# Patient Record
Sex: Female | Born: 1990 | Race: White | Hispanic: No | Marital: Married | Smoking: Never smoker
Health system: Southern US, Community
[De-identification: ages and names within clinical notes are randomized; demographics above are authoritative.]

## PROBLEM LIST (undated history)

## (undated) DIAGNOSIS — O139 Gestational [pregnancy-induced] hypertension without significant proteinuria, unspecified trimester: Secondary | ICD-10-CM

## (undated) HISTORY — PX: WISDOM TOOTH EXTRACTION: SHX21

## (undated) HISTORY — DX: Gestational (pregnancy-induced) hypertension without significant proteinuria, unspecified trimester: O13.9

## (undated) HISTORY — PX: DRUG INDUCED ENDOSCOPY: SHX6808

---

## 2019-04-21 DIAGNOSIS — Z01419 Encounter for gynecological examination (general) (routine) without abnormal findings: Secondary | ICD-10-CM | POA: Diagnosis not present

## 2019-04-21 DIAGNOSIS — Z124 Encounter for screening for malignant neoplasm of cervix: Secondary | ICD-10-CM | POA: Diagnosis not present

## 2019-04-21 DIAGNOSIS — N62 Hypertrophy of breast: Secondary | ICD-10-CM | POA: Diagnosis not present

## 2019-06-13 ENCOUNTER — Institutional Professional Consult (permissible substitution): Payer: BC Managed Care – PPO | Admitting: Plastic Surgery

## 2019-06-18 ENCOUNTER — Encounter: Payer: Self-pay | Admitting: Plastic Surgery

## 2019-06-18 ENCOUNTER — Ambulatory Visit: Payer: BC Managed Care – PPO | Admitting: Plastic Surgery

## 2019-06-18 ENCOUNTER — Other Ambulatory Visit: Payer: Self-pay

## 2019-06-18 DIAGNOSIS — M549 Dorsalgia, unspecified: Secondary | ICD-10-CM | POA: Insufficient documentation

## 2019-06-18 DIAGNOSIS — M542 Cervicalgia: Secondary | ICD-10-CM

## 2019-06-18 DIAGNOSIS — M546 Pain in thoracic spine: Secondary | ICD-10-CM | POA: Diagnosis not present

## 2019-06-18 DIAGNOSIS — G8929 Other chronic pain: Secondary | ICD-10-CM

## 2019-06-18 DIAGNOSIS — N62 Hypertrophy of breast: Secondary | ICD-10-CM

## 2019-06-18 NOTE — Progress Notes (Signed)
Patient ID: Kelsey Barnett, female    DOB: [birth year], 29 y.o.   MRN: [id number]   Chief Complaint  Patient presents with  . Breast Problem    Mammary Hyperplasia: The patient is a 29 y.o. female with a history of mammary hyperplasia for several years.  She has extremely large breasts causing symptoms that include the following: Back pain in the upper and lower back, including neck pain. She pulls or pins her bra straps to provide better lift and relief of the pressure and pain. She notices relief by holding her breast up manually.  Her shoulder straps cause grooves and pain and pressure that requires padding for relief. Pain medication is sometimes required with motrin and tylenol.  Activities that are hindered by enlarged breasts include: exercise and running. She also noticed that she carries the left shoulder significantly higher which may be related to the size difference.  Her breasts are extremely large and fairly symmetric but the left is likely 200 gm larger than the right.  She has hyperpigmentation of the inframammary area on both sides.  The sternal to nipple distance on the right is 21 cm and the left is 23 cm.  The IMF distance is 11 cm.  She is 5 feet 3 inches tall and weighs 130 pounds.  Preoperative bra size = 34DD cup.  The estimated excess breast tissue to be removed at the time of surgery = 300 grams on the left and 300 grams on the right.  Mammogram history: none.  The patient's maternal grandfather dad 5 sisters that died with breast cancer.  She has not had PT as of this time.   She is married and does not have any children.   Review of Systems  Constitutional: Positive for activity change. Negative for appetite change.  HENT: Negative.   Eyes: Negative.   Respiratory: Negative for chest tightness and shortness of breath.   Cardiovascular: Negative.   Gastrointestinal: Negative.   Endocrine: Negative.   Genitourinary: Negative.   Musculoskeletal: Positive for back pain  and neck pain.  Skin: Negative.   Neurological: Negative.   Hematological: Negative.   Psychiatric/Behavioral: Negative.     History reviewed. No pertinent past medical history.  History reviewed. No pertinent surgical history.   No current outpatient medications on file.   Objective:   Vitals:   06/18/19 1420  BP: 119/80  Pulse: 95  Temp: 98 F (36.7 C)  SpO2: 99%    Physical Exam Vitals and nursing note reviewed.  Constitutional:      Appearance: Normal appearance.  HENT:     Head: Normocephalic and atraumatic.  Cardiovascular:     Rate and Rhythm: Normal rate.     Pulses: Normal pulses.  Pulmonary:     Effort: Pulmonary effort is normal.  Abdominal:     General: Abdomen is flat. There is no distension.     Tenderness: There is no abdominal tenderness.  Skin:    General: Skin is warm.     Capillary Refill: Capillary refill takes less than 2 seconds.  Neurological:     General: No focal deficit present.     Mental Status: She is alert and oriented to person, place, and time.  Psychiatric:        Mood and Affect: Mood normal.        Behavior: Behavior normal.     Assessment & Plan:  Symptomatic mammary hypertrophy  Chronic bilateral thoracic back pain  Neck pain  Recommend  bilateral breast reduction, PT and mammogram.  Will request release of OB/GYN note.  The risk that can be encountered with breast reduction were discussed and include the following but not limited to these:  Breast asymmetry, fluid accumulation, firmness of the breast, inability to breast feed, loss of nipple or areola, skin loss, decrease or no nipple sensation, fat necrosis of the breast tissue, bleeding, infection, healing delay.  There are risks of anesthesia, changes to skin sensation and injury to nerves or blood vessels.  The muscle can be temporarily or permanently injured.  You may have an allergic reaction to tape, suture, glue, blood products which can result in skin  discoloration, swelling, pain, skin lesions, poor healing.  Any of these can lead to the need for revisonal surgery or stage procedures.  A reduction has potential to interfere with diagnostic procedures.  Nipple or breast piercing can increase risks of infection.  This procedure is best done when the breast is fully developed.  Changes in the breast will continue to occur over time.  Pregnancy can alter the outcomes of previous breast reduction surgery, weight gain and weigh loss can also effect the long term appearance.    Alena Bills Eydan Chianese, DO

## 2019-06-20 ENCOUNTER — Other Ambulatory Visit: Payer: Self-pay

## 2019-06-20 ENCOUNTER — Ambulatory Visit
Admission: RE | Admit: 2019-06-20 | Discharge: 2019-06-20 | Disposition: A | Discharge status: Home / Self Care | Payer: BC Managed Care – PPO | Source: Ambulatory Visit | Attending: Plastic Surgery | Admitting: Plastic Surgery

## 2019-06-20 DIAGNOSIS — G8929 Other chronic pain: Secondary | ICD-10-CM

## 2019-06-20 DIAGNOSIS — N62 Hypertrophy of breast: Secondary | ICD-10-CM

## 2019-06-20 DIAGNOSIS — M542 Cervicalgia: Secondary | ICD-10-CM

## 2019-06-20 DIAGNOSIS — M546 Pain in thoracic spine: Secondary | ICD-10-CM

## 2019-06-20 DIAGNOSIS — Z1231 Encounter for screening mammogram for malignant neoplasm of breast: Secondary | ICD-10-CM | POA: Diagnosis not present

## 2019-07-09 ENCOUNTER — Encounter: Payer: Self-pay | Admitting: Physical Therapy

## 2019-07-09 ENCOUNTER — Other Ambulatory Visit: Payer: Self-pay

## 2019-07-09 ENCOUNTER — Ambulatory Visit: Payer: BC Managed Care – PPO | Attending: Plastic Surgery | Admitting: Physical Therapy

## 2019-07-09 DIAGNOSIS — G8929 Other chronic pain: Secondary | ICD-10-CM

## 2019-07-09 DIAGNOSIS — M25512 Pain in left shoulder: Secondary | ICD-10-CM | POA: Insufficient documentation

## 2019-07-09 DIAGNOSIS — M546 Pain in thoracic spine: Secondary | ICD-10-CM | POA: Insufficient documentation

## 2019-07-10 NOTE — Therapy (Signed)
[facility] Outpatient Rehabilitation [facility] [address] [place], [place], [address] Phone: [phone]   Fax:  [phone]  Physical Therapy Evaluation  Patient Details  Name: Kelsey Barnett MRN: [id number] Date of Birth: August 19, 1990 Referring Provider (PT): Foster Simpson, DO   Encounter Date: 07/09/2019  PT End of Session - 07/09/19 1501    Visit Number  1    Number of Visits  7    Authorization Type  BCBS- 60 vl    PT Start Time  1500    PT Stop Time  1539    PT Time Calculation (min)  39 min    Activity Tolerance  Patient tolerated treatment well    Behavior During Therapy  [facility] for tasks assessed/performed       History reviewed. No pertinent past medical history.  History reviewed. No pertinent surgical history.  There were no vitals filed for this visit.   Subjective Assessment - 07/09/19 1501    Subjective  Mid thoracic region pops any time I sit up. Shoulders and back ache after sitting at work. Left shoulder is worse, running seems to set it off. Remember shoulder pain back to 2013 with back pain. 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 Yoga and running for exercise.    How long can you sit comfortably?  before lunch time find myself changing position    Currently in Pain?  Yes    Pain Score  4     Pain Location  Back    Pain Orientation  Upper    Pain Descriptors / Indicators  Aching    Aggravating Factors   sitting, running for shoulder, standing    Pain Relieving Factors  stretching, heat         [facility] PT Assessment - 07/09/19 0001      Assessment   Medical Diagnosis  mammary hypertrophy, neck pain    Referring Provider (PT)  Foster Simpson, DO    Onset Date/Surgical Date  --   2013   Hand Dominance  Right      Precautions   Precautions  None      Restrictions   Weight Bearing Restrictions  No      Balance Screen   Has the patient fallen in the past 6 months  No      Home Environment   Living  Environment  Private residence    Living Arrangements  Spouse/significant other      Prior Function   Level of Independence  Independent    Vocation  Full time employment    Microbiologist on computer, working from home      Cognition   Overall Cognitive Status  Within Functional Limits for tasks assessed      Observation/Other Assessments   Focus on Therapeutic Outcomes (FOTO)   --   not entered     Sensation   Additional Comments  Lt N/T from shoulder to elbow, HA weekly      Posture/Postural Control   Posture Comments  decreased thoracic and lumbar curves with bil winging scapula      ROM / Strength   AROM / PROM / Strength  Strength      Strength   Overall Strength Comments  gross shoulder 5/5    Strength Assessment Site  Shoulder    Right/Left Shoulder  Right;Left      Palpation   Palpation comment  tightness through upper traps and all periscapular musculature  of care cert/re-cert     Problem List Patient Active Problem List   Diagnosis Date Noted  . Symptomatic mammary hypertrophy 06/18/2019  . Back pain 06/18/2019  . Neck pain 06/18/2019    Sholonda Jobst C. Reubin Bushnell PT, DPT 07/10/19 6:35 AM   [facility] Outpatient Rehabilitation [facility] [address] [place], [place], [address] Phone: [phone]   Fax:  [phone]  Name: Kelsey Barnett MRN: [id number] Date of Birth: 10-Nov-1990  of care cert/re-cert     Problem List Patient Active Problem List   Diagnosis Date Noted  . Symptomatic mammary hypertrophy 06/18/2019  . Back pain 06/18/2019  . Neck pain 06/18/2019    Sholonda Jobst C. Reubin Bushnell PT, DPT 07/10/19 6:35 AM   [facility] Outpatient Rehabilitation [facility] [address] [place], [place], [address] Phone: [phone]   Fax:  [phone]  Name: Kelsey Barnett MRN: [id number] Date of Birth: 10-Nov-1990

## 2019-07-21 ENCOUNTER — Encounter: Payer: BC Managed Care – PPO | Admitting: Physical Therapy

## 2019-07-23 ENCOUNTER — Ambulatory Visit: Payer: BC Managed Care – PPO | Admitting: Physical Therapy

## 2019-07-25 ENCOUNTER — Encounter: Payer: Self-pay | Admitting: Physical Therapy

## 2019-07-25 ENCOUNTER — Ambulatory Visit: Payer: BC Managed Care – PPO | Attending: Plastic Surgery | Admitting: Physical Therapy

## 2019-07-25 ENCOUNTER — Other Ambulatory Visit: Payer: Self-pay

## 2019-07-25 DIAGNOSIS — G8929 Other chronic pain: Secondary | ICD-10-CM | POA: Insufficient documentation

## 2019-07-25 DIAGNOSIS — M546 Pain in thoracic spine: Secondary | ICD-10-CM

## 2019-07-25 DIAGNOSIS — M25512 Pain in left shoulder: Secondary | ICD-10-CM | POA: Insufficient documentation

## 2019-07-25 NOTE — Therapy (Signed)
[facility] Outpatient Rehabilitation [facility] [address] [place], [place], [address] Phone: [phone]   Fax:  [phone]  Physical Therapy Treatment  Patient Details  Name: Kelsey Barnett MRN: [id number] Date of Birth: 17-Mar-1990 Referring Provider (PT): Foster Simpson, DO   Encounter Date: 07/25/2019   PT End of Session - 07/25/19 0806    Visit Number 2    Number of Visits 7    Authorization Type BCBS- 60 vl    PT Start Time 0802    PT Stop Time 0850    PT Time Calculation (min) 48 min           History reviewed. No pertinent past medical history.  History reviewed. No pertinent surgical history.  There were no vitals filed for this visit.   Subjective Assessment - 07/25/19 0803    Subjective Exercises are going good, like the stretches. A little sore today.    Currently in Pain? Yes    Pain Score 2     Pain Location Back    Pain Orientation Upper    Pain Descriptors / Indicators Sore    Pain Type Chronic pain    Aggravating Factors  sitting, running, standing    Pain Relieving Factors stretching, heat                             [facility] Adult PT Treatment/Exercise - 07/25/19 0001      Lumbar Exercises: Machines for Strengthening   Other Lumbar Machine Exercise UBE forward and reverse x 2.5 minutes each      Lumbar Exercises: Supine   AB Set Limitations Rib breathing per HEP       Lumbar Exercises: Prone   Other Prone Lumbar Exercises --    Other Prone Lumbar Exercises Prone retraction with extension x 10 , Prone  T, W back       Lumbar Exercises: Quadruped   Madcat/Old Horse 10 reps    Other Quadruped Lumbar Exercises lawn mower pulls 10 x 2 green band     Other Quadruped Lumbar Exercises Primal press 15 sec x 3, qped protract/retract       Modalities   Modalities Moist Heat      Moist Heat Therapy   Number Minutes Moist Heat 10 Minutes    Moist Heat Location Cervical                        PT Long Term Goals - 07/09/19 1547      PT LONG TERM GOAL #1   Title pt will verbalize use of frequent mobility breaks during her day to keep pain minimal    Baseline pain can get pretty significant during the day    Time 4    Period Weeks    Status New    Target Date 08/08/19      PT LONG TERM GOAL #2   Title pt will be independent in long term HEP for rib mobility and periscapular stability    Baseline will progress as appropriate    Time 4    Period Weeks    Status New    Target Date 08/08/19      PT LONG TERM GOAL #3   Title pt will demo resolution of resting scapular winging    Baseline bilateral at rest at eval    Time 4    Period Weeks    Status New  Target Date 08/08/19                 Plan - 07/25/19 0841    Clinical Impression Statement Pt reports lower level of pain today. She is taking more standing/walking breaks while working at computer. Reviewed HEP and progressed with prone scap stabilization. Manual STW performed to bilateral upper trap/periscap muscles. HMP at end of session to decrease soreness tension.    PT Next Visit Plan manual for thoracic mobility, consider DN, refomer    PT Home Exercise Plan [email or web address]: HL rib cage breathing, cat/camel, qped lawn mower, door squat breathing (PRI);Access Code: [email or web address]: prone I, T, Bent T           Patient will benefit from skilled therapeutic intervention in order to improve the following deficits and impairments:  Decreased activity tolerance, Pain, Increased muscle spasms, Hypomobility, Postural dysfunction  Visit Diagnosis: Pain in thoracic spine  Chronic left shoulder pain     Problem List Patient Active Problem List   Diagnosis Date Noted  . Symptomatic mammary hypertrophy 06/18/2019  . Back pain 06/18/2019  . Neck pain 06/18/2019    Sherrie Mustache, PTA 07/25/2019, 10:36 AM  [facility] [address] [place], [place], [address] Phone: [phone]   Fax:  [phone]  Name: Kelsey Barnett MRN: [id number] Date of Birth: 18-Jun-1990

## 2019-07-28 ENCOUNTER — Encounter: Payer: Self-pay | Admitting: Physical Therapy

## 2019-07-28 ENCOUNTER — Other Ambulatory Visit: Payer: Self-pay

## 2019-07-28 ENCOUNTER — Ambulatory Visit: Payer: BC Managed Care – PPO | Admitting: Physical Therapy

## 2019-07-28 DIAGNOSIS — M546 Pain in thoracic spine: Secondary | ICD-10-CM | POA: Diagnosis not present

## 2019-07-28 DIAGNOSIS — G8929 Other chronic pain: Secondary | ICD-10-CM

## 2019-07-28 DIAGNOSIS — M25512 Pain in left shoulder: Secondary | ICD-10-CM | POA: Diagnosis not present

## 2019-07-28 NOTE — Therapy (Signed)
[facility] Outpatient Rehabilitation [facility] [address] [place], [place], [address] Phone: [phone]   Fax:  [phone]  Physical Therapy Treatment  Patient Details  Name: Mishika Flippen MRN: [id number] Date of Birth: February 08, 1991 Referring Provider (PT): Foster Simpson, DO   Encounter Date: 07/28/2019   PT End of Session - 07/28/19 1236    Visit Number 3    Number of Visits 7    Authorization Type BCBS- 60 vl    PT Start Time 1230    PT Stop Time 1325    PT Time Calculation (min) 55 min           History reviewed. No pertinent past medical history.  History reviewed. No pertinent surgical history.  There were no vitals filed for this visit.   Subjective Assessment - 07/28/19 1234    Subjective I am more sore now because it is the afternoon. Going good with the exercises.    Currently in Pain? Yes    Pain Score 5     Pain Location Back    Pain Orientation Upper;Mid;Left    Pain Descriptors / Indicators Sore;Burning;Numbness   tension   Pain Type Chronic pain    Aggravating Factors  sitting,  running, standing    Pain Relieving Factors stretching, heat                             [facility] Adult PT Treatment/Exercise - 07/28/19 0001      Lumbar Exercises: Stretches   Other Lumbar Stretch Exercise open books with green band x 10 each       Lumbar Exercises: Machines for Strengthening   Other Lumbar Machine Exercise UBE L2 forward and reverse x 3 minutes each      Lumbar Exercises: Standing   Other Standing Lumbar Exercises doorway squat with bar- cues for breathing and reaching       Lumbar Exercises: Supine   AB Set Limitations Rib breathing per HEP     Other Supine Lumbar Exercises green band horizontal abduction, ER       Lumbar Exercises: Prone   Other Prone Lumbar Exercises Prone retraction with extension x 15 , Prone  T, W back , then over small stability ball       Lumbar Exercises: Quadruped   Madcat/Old  Horse 10 reps    Opposite Arm/Leg Raise 10 reps    Other Quadruped Lumbar Exercises lawn mower pulls 10 x 2 green band     Other Quadruped Lumbar Exercises Primal press 15 sec x 3, qped protract/retract       Moist Heat Therapy   Number Minutes Moist Heat 10 Minutes    Moist Heat Location Cervical                       PT Long Term Goals - 07/09/19 1547      PT LONG TERM GOAL #1   Title pt will verbalize use of frequent mobility breaks during her day to keep pain minimal    Baseline pain can get pretty significant during the day    Time 4    Period Weeks    Status New    Target Date 08/08/19      PT LONG TERM GOAL #2   Title pt will be independent in long term HEP for rib mobility and periscapular stability    Baseline will progress as appropriate    Time  4    Period Weeks    Status New    Target Date 08/08/19      PT LONG TERM GOAL #3   Title pt will demo resolution of resting scapular winging    Baseline bilateral at rest at eval    Time 4    Period Weeks    Status New    Target Date 08/08/19                 Plan - 07/28/19 1327    Clinical Impression Statement Pt reports compliance with HEP and no change in symptoms. Continued with thoracic/core stability challenges and mobility therex. HMP at end of session, She reported no increased pain with session today.    PT Next Visit Plan manual for thoracic mobility, consider DN, refomer    PT Home Exercise Plan [email or web address]: HL rib cage breathing, cat/camel, qped lawn mower, door squat breathing (PRI);Access Code: [email or web address]: prone I, T, Bent T           Patient will benefit from skilled therapeutic intervention in order to improve the following deficits and impairments:  Decreased activity tolerance, Pain, Increased muscle spasms, Hypomobility, Postural dysfunction  Visit Diagnosis: Pain in thoracic spine  Chronic left shoulder pain     Problem List Patient Active Problem List   Diagnosis  Date Noted  . Symptomatic mammary hypertrophy 06/18/2019  . Back pain 06/18/2019  . Neck pain 06/18/2019    Dorene Ar, PTA 07/28/2019, 1:54 PM  [place], [place], [address] Phone: [phone]   Fax:  [phone]  Name: Rekha Hobbins MRN: [id number] Date of Birth: 1990-06-07

## 2019-07-30 ENCOUNTER — Ambulatory Visit: Payer: BC Managed Care – PPO | Admitting: Physical Therapy

## 2019-07-30 ENCOUNTER — Encounter: Payer: Self-pay | Admitting: Physical Therapy

## 2019-07-30 ENCOUNTER — Other Ambulatory Visit: Payer: Self-pay

## 2019-07-30 DIAGNOSIS — M25512 Pain in left shoulder: Secondary | ICD-10-CM | POA: Diagnosis not present

## 2019-07-30 DIAGNOSIS — G8929 Other chronic pain: Secondary | ICD-10-CM | POA: Diagnosis not present

## 2019-07-30 DIAGNOSIS — M546 Pain in thoracic spine: Secondary | ICD-10-CM

## 2019-07-30 NOTE — Therapy (Signed)
Weeks    Status Partially Met      PT LONG TERM GOAL #2   Title pt will be independent in long term HEP for rib mobility and periscapular stability    Baseline will progress as appropriate    Time 4    Period Weeks    Status On-going      PT LONG TERM GOAL #3   Title pt will demo resolution of resting scapular winging    Baseline bilateral at rest at eval    Period Weeks    Status On-going                 Plan - 07/30/19 1404    Clinical Impression Statement Continued with increased stability challenges today. She reports no change in pain.    PT Treatment/Interventions ADLs/Self Care Home Management;Cryotherapy;Electrical Stimulation;Iontophoresis 66m/ml Dexamethasone;Ultrasound;Traction;Moist Heat;Therapeutic activities;Therapeutic exercise;Neuromuscular re-education;Patient/family education;Passive range of motion;Manual techniques;Dry needling;Taping;Spinal Manipulations;Joint Manipulations    PT Next Visit Plan manual for thoracic mobility, consider DN, refomer    PT Home Exercise Plan [email or web address]: HL rib cage breathing, cat/camel, qped lawn mower, door squat breathing (PRI);Access Code: [email or web address] prone I, T, Bent T            Patient will benefit from skilled therapeutic intervention in order to improve the following deficits and impairments:  Decreased activity tolerance, Pain, Increased muscle spasms, Hypomobility, Postural dysfunction  Visit Diagnosis: Pain in thoracic spine  Chronic left shoulder pain     Problem List Patient Active Problem List   Diagnosis Date Noted  . Symptomatic mammary hypertrophy 06/18/2019  . Back pain 06/18/2019  . Neck pain 06/18/2019    DDorene Ar, PTA 07/30/2019, 2:27 PM  [facility] [address]Phone: [phone]  Fax:  [phone] Name: Kelsey NarayanMRN: [id number] of Birth: 9Mar 12, 1992  [place], [place], [address] Phone: [phone]   Fax:  [phone]  Physical Therapy Treatment  Patient Details  Name: Kelsey Barnett MRN: [id number] Date of Birth: Jul 12, 1990 Referring Provider (PT): Audelia Hives, DO   Encounter Date: 07/30/2019   PT End of Session - 07/30/19 1234    Visit Number 4    Number of Visits 7    Date for PT Re-Evaluation 08/08/19    Authorization Type BCBS- 60 vl    PT Start Time 1230    PT Stop Time 1315    PT Time Calculation (min) 45 min           History reviewed. No pertinent past medical history.  History reviewed. No pertinent surgical history.  There were no vitals filed for this visit.   Subjective Assessment - 07/30/19 1233    Subjective About normal, pain is 4-5/10 today.    Currently in Pain? Yes    Pain Score 4     Pain Location Back    Pain Orientation Upper;Left;Right    Pain Descriptors / Indicators Burning;Sore    Pain Type Chronic pain                             [facility] Adult PT Treatment/Exercise - 07/30/19 0001      Ambulation/Gait   Gait velocity --      Lumbar Exercises: Stretches   Quadruped Mid Back Stretch Limitations childs pose with reach through    Other Lumbar Stretch Exercise open books with green band x 10 each       Lumbar Exercises: Machines for Strengthening   Other Lumbar Machine Exercise UBE L3 forward and reverse x 3 minutes each      Lumbar Exercises: Standing   Row 20 reps    Theraband Level (Row) Level 3 (Green)    Shoulder Extension 20 reps    Theraband Level (Shoulder Extension) Level 3 (Green)    Other Standing Lumbar Exercises doorway squat with bar- cues for breathing and reaching    x5     Lumbar Exercises: Supine   AB Set Limitations Rib breathing per HEP     Other Supine Lumbar Exercises green band horizontal abduction, ER       Lumbar Exercises: Prone   Other Prone Lumbar  Exercises Prone plank : elbows and toes 3 x 20 seconds     Other Prone Lumbar Exercises prone I, T 2# x 15 : Y AROM, Super mans 10 x 2       Lumbar Exercises: Quadruped   Madcat/Old Horse 10 reps    Opposite Arm/Leg Raise 10 reps    Other Quadruped Lumbar Exercises lawn mower pulls 10 x 2 green band     Other Quadruped Lumbar Exercises Primal press 15 sec x 3, qped protract/retract       Moist Heat Therapy   Number Minutes Moist Heat --   declined   Moist Heat Location --                       PT Long Term Goals - 07/30/19 1324      PT LONG TERM GOAL #1   Title pt will verbalize use of frequent mobility breaks during her day to keep pain minimal    Baseline pain still rises to 4-5/10 with breaks    Time 4    Period

## 2019-08-04 ENCOUNTER — Encounter: Payer: Self-pay | Admitting: Physical Therapy

## 2019-08-04 ENCOUNTER — Ambulatory Visit: Payer: BC Managed Care – PPO | Admitting: Physical Therapy

## 2019-08-04 ENCOUNTER — Other Ambulatory Visit: Payer: Self-pay

## 2019-08-04 DIAGNOSIS — G8929 Other chronic pain: Secondary | ICD-10-CM | POA: Diagnosis not present

## 2019-08-04 DIAGNOSIS — M546 Pain in thoracic spine: Secondary | ICD-10-CM

## 2019-08-04 DIAGNOSIS — M25512 Pain in left shoulder: Secondary | ICD-10-CM | POA: Diagnosis not present

## 2019-08-04 NOTE — Therapy (Signed)
[facility] [address] [place], [place], [address] Phone: [phone]   Fax:  [phone]  Physical Therapy Treatment/ReCertification  Patient Details  Name: Vi Claborn MRN: [id number] Date of Birth: 04/20/1990 Referring Provider (PT): Foster Simpson, DO   Encounter Date: 08/04/2019   PT End of Session - 08/04/19 1507    Visit Number 5    Number of Visits 7    Date for PT Re-Evaluation 08/13/19    Authorization Type BCBS- 60 vl    PT Start Time 1501    PT Stop Time 1544    PT Time Calculation (min) 43 min    Activity Tolerance Patient tolerated treatment well    Behavior During Therapy [facility] for tasks assessed/performed           History reviewed. No pertinent past medical history.  History reviewed. No pertinent surgical history.  There were no vitals filed for this visit.   Subjective Assessment - 08/04/19 1506    Subjective willing to try DN. doing HEP.    Currently in Pain? 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, working from home      Posture/Postural Control   Posture Comments bil winging on medial-lower quadrants of scapulae, Lt worse than Rt, limited thoracic kyphosis and mobility                         [facility] Adult PT Treatment/Exercise - 08/04/19 0001      Lumbar Exercises: Machines for Strengthening   Other Lumbar Machine Exercise UBE retro 2 min L2    Other Lumbar Machine Exercise lat pull down &  narrow row 35lb      Lumbar Exercises:  Supine   Other Supine Lumbar Exercises supine protraction    Other Supine Lumbar Exercises supine scissors UE      Manual Therapy   Manual therapy comments skilled palpation and monitoring during TPDN    Joint Mobilization prone thoracic PA    Soft tissue mobilization bil upper traps, rhomboids, subscap & scapular distraction            Trigger Point Dry Needling - 08/04/19 0001    Consent Given? Yes    Education Handout Provided --   verbal education   Muscles Treated Head and Neck Upper trapezius   bil   Muscles Treated Upper Quadrant Rhomboids;Subscapularis    Upper Trapezius Response Twitch reponse elicited;Palpable increased muscle length   bil   Rhomboids Response Twitch response elicited;Palpable increased muscle length   bil   Subscapularis Response Twitch response elicited;Palpable increased muscle length   bil               PT Education - 08/04/19 1602    Education Details TPDN & exxpected outcomes    Person(s) Educated Patient    Methods Explanation    Comprehension  Verbalized understanding;Need further instruction               PT Long Term Goals - 08/04/19 1541      PT LONG TERM GOAL #1   Title pt will verbalize use of frequent mobility breaks during her day to keep pain minimal    Baseline 45 min breaks, still has pain after about 25    Time 1    Period Weeks    Status On-going    Target Date 08/13/19      PT LONG TERM GOAL #2   Title pt will be independent in long term HEP for rib mobility and periscapular stability    Status On-going      PT LONG TERM GOAL #3   Title pt will demo resolution of resting scapular winging    Baseline bil winging, not bad but more pronounced on the Lt    Status On-going                 Plan - 08/04/19 1545    Clinical Impression Statement DN utilized to upper traps and periscapular regions today that resulted in change of pain to soreness as expected. Added release to bil hips for cont post chain  stretching. Pt reports she begins to feel pain around 25 min and we discussed being as proactive as possible with moving breaks from the computer as she can with work.    PT Treatment/Interventions ADLs/Self Care Home Management;Cryotherapy;Electrical Stimulation;Iontophoresis 4mg /ml Dexamethasone;Ultrasound;Traction;Moist Heat;Therapeutic activities;Therapeutic exercise;Neuromuscular re-education;Patient/family education;Passive range of motion;Manual techniques;Dry needling;Taping;Spinal Manipulations;Joint Manipulations    PT Next Visit Plan outcome of DN?    PT Home Exercise Plan [id number]: HL rib cage breathing, cat/camel, qped lawn mower, door squat breathing (PRI);Access Code: [email or web address]: prone I, T, Bent T, blue tband row & lat pull    Consulted and Agree with Plan of Care Patient           Patient will benefit from skilled therapeutic intervention in order to improve the following deficits and impairments:  Decreased activity tolerance, Pain, Increased muscle spasms, Hypomobility, Postural dysfunction  Visit Diagnosis: Pain in thoracic spine - Plan: PT plan of care cert/re-cert  Chronic left shoulder pain - Plan: PT plan of care cert/re-cert     Problem List Patient Active Problem List   Diagnosis Date Noted  . Symptomatic mammary hypertrophy 06/18/2019  . Back pain 06/18/2019  . Neck pain 06/18/2019    Yaasir Menken C. Amber Guthridge PT, DPT 08/04/19 4:08 PM   [facility] [address] [place], [place], [address] Phone: [phone]   Fax:  [phone]  Name: Cynthina Penado MRN: [id number] Date of Birth: September 29, 1990

## 2019-08-08 ENCOUNTER — Other Ambulatory Visit: Payer: Self-pay

## 2019-08-08 ENCOUNTER — Ambulatory Visit: Payer: BC Managed Care – PPO | Admitting: Physical Therapy

## 2019-08-08 ENCOUNTER — Encounter: Payer: Self-pay | Admitting: Physical Therapy

## 2019-08-08 DIAGNOSIS — M546 Pain in thoracic spine: Secondary | ICD-10-CM

## 2019-08-08 DIAGNOSIS — G8929 Other chronic pain: Secondary | ICD-10-CM | POA: Diagnosis not present

## 2019-08-08 DIAGNOSIS — M25512 Pain in left shoulder: Secondary | ICD-10-CM | POA: Diagnosis not present

## 2019-08-08 NOTE — Therapy (Signed)
[facility] Out[facility] Rehabilitation [facility] [address] [place], [place], [address] Phone: [phone]   Fax:  [phone]  Physical Therapy Treatment  Patient Details  Name: Kelsey Barnett MRN: [id number] Date of Birth: [birth year] Referring Provider (PT): Foster Simpson, DO   Encounter Date: 08/08/2019   PT End of Session - 08/08/19 0850    Visit Number 6    Number of Visits 7    Date for PT Re-Evaluation 08/13/19    Authorization Type BCBS- 60 vl    PT Start Time 0845    PT Stop Time 0925    PT Time Calculation (min) 40 min           History reviewed. No pertinent past medical history.  History reviewed. No pertinent surgical history.  There were no vitals filed for this visit.   Subjective Assessment - 08/08/19 0847    Subjective Was very sore and had fever after TPDN, overall feels that knot in shoulder is a little smaller.    Currently in Pain? Yes    Pain Score 3     Pain Location Back    Pain Orientation Upper    Pain Descriptors / Indicators Sore    Pain Type Chronic pain    Aggravating Factors  sitting, running , standing    Pain Relieving Factors stretching, heat                             [facility] Adult PT Treatment/Exercise - 08/08/19 0001      Lumbar Exercises: Stretches   Quadruped Mid Back Stretch Limitations childs pose with reach through    Other Lumbar Stretch Exercise open books with green band x 10 each       Lumbar Exercises: Machines for Strengthening   Other Lumbar Machine Exercise UBE retro 2 min L2    Other Lumbar Machine Exercise lat pull down &  narrow row 35lb      Lumbar Exercises: Standing   Row 20 reps    Theraband Level (Row) Level 3 (Green)    Shoulder Extension 20 reps    Theraband Level (Shoulder Extension) Level 3 (Green)      Lumbar Exercises: Supine   Other Supine Lumbar Exercises supine protraction 2# weights    Other Supine Lumbar Exercises supine scissors UE 2#        Lumbar Exercises: Prone   Other Prone Lumbar Exercises Prone plank : elbows and toes 2 x 30 seconds , primal press 30 sec x 2    Other Prone Lumbar Exercises prone I, T 2# x 15 : Y AROM, Super mans 10 x 2       Lumbar Exercises: Quadruped   Madcat/Old Horse 10 reps    Opposite Arm/Leg Raise 10 reps    Opposite Arm/Leg Raise Limitations knee to elbow x 5 each, 2 rounds     Other Quadruped Lumbar Exercises lawn mower pulls 15 each  green band                        PT Long Term Goals - 08/04/19 1541      PT LONG TERM GOAL #1   Title pt will verbalize use of frequent mobility breaks during her day to keep pain minimal    Baseline 45 min breaks, still has pain after about 25    Time 1    Period Weeks    Status On-going  Target Date 08/13/19      PT LONG TERM GOAL #2   Title pt will be independent in long term HEP for rib mobility and periscapular stability    Status On-going      PT LONG TERM GOAL #3   Title pt will demo resolution of resting scapular winging    Baseline bil winging, not bad but more pronounced on the Lt    Status On-going                 Plan - 08/08/19 1884    Clinical Impression Statement She thinks TPDN decrease the knot in her shoulder blade. Overall a small improvement in pain. Continued with strength and stabilization per POC. She has one more visit.    PT Next Visit Plan DC, FOTO is needed    PT Home Exercise Plan [email or web address]: HL rib cage breathing, cat/camel, qped lawn mower, door squat breathing (PRI);Access Code: [email or web address]: prone I, T, Bent T, blue tband row & lat pull           Patient will benefit from skilled therapeutic intervention in order to improve the following deficits and impairments:  Decreased activity tolerance, Pain, Increased muscle spasms, Hypomobility, Postural dysfunction  Visit Diagnosis: Pain in thoracic spine  Chronic left shoulder pain     Problem List Patient Active Problem List   Diagnosis Date  Noted  . Symptomatic mammary hypertrophy 06/18/2019  . Back pain 06/18/2019  . Neck pain 06/18/2019    Sherrie Mustache, PTA 08/08/2019, 9:29 AM  [facility] [address] [place], [place], [address] Phone: [phone]   Fax:  [phone]  Name: Kelsey Barnett MRN: [id number] Date of Birth: 1990/04/18

## 2019-08-11 ENCOUNTER — Other Ambulatory Visit: Payer: Self-pay

## 2019-08-11 ENCOUNTER — Ambulatory Visit: Payer: BC Managed Care – PPO | Admitting: Physical Therapy

## 2019-08-11 ENCOUNTER — Encounter: Payer: Self-pay | Admitting: Physical Therapy

## 2019-08-11 DIAGNOSIS — M546 Pain in thoracic spine: Secondary | ICD-10-CM

## 2019-08-11 DIAGNOSIS — M25512 Pain in left shoulder: Secondary | ICD-10-CM

## 2019-08-11 DIAGNOSIS — G8929 Other chronic pain: Secondary | ICD-10-CM | POA: Diagnosis not present

## 2019-08-11 NOTE — Therapy (Addendum)
[place], [place], [address] Phone: [phone]   Fax:  [phone]  Physical Therapy Treatment/Discharge  Patient Details  Name: Kelsey Barnett MRN: [id number] Date of Birth: 02/26/1990 Referring Provider (PT): Audelia Hives, DO   Encounter Date: 08/11/2019   PT End of Session - 08/11/19 1347    Visit Number 7    Number of Visits 7    Date for PT Re-Evaluation 08/13/19    Authorization Type BCBS- 60 vl    PT Start Time 1314    PT Stop Time 6962    PT Time Calculation (min) 38 min           History reviewed. No pertinent past medical history.  History reviewed. No pertinent surgical history.  There were no vitals filed for this visit.   Subjective Assessment - 08/11/19 1350    Subjective Feel stronged in shoulders and arms. Pain still the same and increases after 25 minutes of working at the computer. I am taking more frequent standing breaks.    Currently in Pain? Yes    Pain Score 4     Pain Location Back    Pain Orientation Upper    Pain Descriptors / Indicators Sore    Pain Type Chronic pain    Aggravating Factors  sitting prolonged, running, standing    Pain Relieving Factors stretching, heat              [facility] PT Assessment - 08/11/19 0001      Observation/Other Assessments   Focus on Therapeutic Outcomes (FOTO)  not set up                         [facility] Adult PT Treatment/Exercise - 08/11/19 0001      Lumbar Exercises: Stretches   Quadruped Mid Back Stretch Limitations childs pose with reach through    Other Lumbar Stretch Exercise open books with green band x 10 each       Lumbar Exercises: Machines for Strengthening   Other Lumbar Machine Exercise UBE retro 3 min L2.5    Other Lumbar Machine Exercise lat pull down &  narrow row 35lb      Lumbar Exercises: Standing   Row 20 reps    Theraband Level (Row) Level 3 (Green)    Shoulder Extension 20  reps    Theraband Level (Shoulder Extension) Level 3 (Green)      Lumbar Exercises: Supine   Other Supine Lumbar Exercises supine protraction 2# weights    Other Supine Lumbar Exercises supine scissors UE 2#       Lumbar Exercises: Prone   Other Prone Lumbar Exercises Prone plank : elbows and toes 2 x 30 seconds , primal press 30 sec x 2    Other Prone Lumbar Exercises prone I, T 2# x 15 : Y AROM, Super mans 10 x 2       Lumbar Exercises: Quadruped   Madcat/Old Horse 10 reps    Opposite Arm/Leg Raise 10 reps    Opposite Arm/Leg Raise Limitations knee to elbow x 5 each, 2 rounds     Other Quadruped Lumbar Exercises lawn mower pulls 15 each  green band                        PT Long Term Goals - 08/11/19 1352      PT LONG TERM GOAL #1   Title pt  [place], [place], [address] Phone: [phone]   Fax:  [phone]  Physical Therapy Treatment/Discharge  Patient Details  Name: Kelsey Barnett MRN: [id number] Date of Birth: [birth year] Referring Provider (PT): Audelia Hives, DO   Encounter Date: 08/11/2019   PT End of Session - 08/11/19 1347    Visit Number 7    Number of Visits 7    Date for PT Re-Evaluation 08/13/19    Authorization Type BCBS- 60 vl    PT Start Time 1314    PT Stop Time 6962    PT Time Calculation (min) 38 min           History reviewed. No pertinent past medical history.  History reviewed. No pertinent surgical history.  There were no vitals filed for this visit.   Subjective Assessment - 08/11/19 1350    Subjective Feel stronged in shoulders and arms. Pain still the same and increases after 25 minutes of working at the computer. I am taking more frequent standing breaks.    Currently in Pain? Yes    Pain Score 4     Pain Location Back    Pain Orientation Upper    Pain Descriptors / Indicators Sore    Pain Type Chronic pain    Aggravating Factors  sitting prolonged, running, standing    Pain Relieving Factors stretching, heat              [facility] PT Assessment - 08/11/19 0001      Observation/Other Assessments   Focus on Therapeutic Outcomes (FOTO)  not set up                         [facility] Adult PT Treatment/Exercise - 08/11/19 0001      Lumbar Exercises: Stretches   Quadruped Mid Back Stretch Limitations childs pose with reach through    Other Lumbar Stretch Exercise open books with green band x 10 each       Lumbar Exercises: Machines for Strengthening   Other Lumbar Machine Exercise UBE retro 3 min L2.5    Other Lumbar Machine Exercise lat pull down &  narrow row 35lb      Lumbar Exercises: Standing   Row 20 reps    Theraband Level (Row) Level 3 (Green)    Shoulder Extension 20  reps    Theraband Level (Shoulder Extension) Level 3 (Green)      Lumbar Exercises: Supine   Other Supine Lumbar Exercises supine protraction 2# weights    Other Supine Lumbar Exercises supine scissors UE 2#       Lumbar Exercises: Prone   Other Prone Lumbar Exercises Prone plank : elbows and toes 2 x 30 seconds , primal press 30 sec x 2    Other Prone Lumbar Exercises prone I, T 2# x 15 : Y AROM, Super mans 10 x 2       Lumbar Exercises: Quadruped   Madcat/Old Horse 10 reps    Opposite Arm/Leg Raise 10 reps    Opposite Arm/Leg Raise Limitations knee to elbow x 5 each, 2 rounds     Other Quadruped Lumbar Exercises lawn mower pulls 15 each  green band                        PT Long Term Goals - 08/11/19 1352      PT LONG TERM GOAL #1   Title pt

## 2019-09-01 NOTE — Progress Notes (Deleted)
Patient is here for follow-up after mammary hyperplasia consult on 06/18/2019 with Dr. Ulice Bold.  At that visit she was 5 feet 3 inches tall and weighed 130 pounds.  She completed 7 physical therapy sessions on 5/26, 6/11, 6/14, 6/16, 6/21, 6/25, and 6/28.

## 2019-09-02 ENCOUNTER — Ambulatory Visit: Payer: BC Managed Care – PPO | Admitting: Plastic Surgery

## 2019-10-15 NOTE — Progress Notes (Signed)
ICD-10-CM   1. Symptomatic mammary hypertrophy  N62   2. Chronic bilateral thoracic back pain  M54.6    G89.29   3. Neck pain  M54.2       Patient ID: Kelsey Barnett, female    DOB: 1991-01-11, 29 y.o.   MRN: [id number]   History of Present Illness: Kelsey Barnett is a 29 y.o.  female  with a history of symptomatic mammary hyperplasia.  She presents for preoperative evaluation for upcoming procedure, bilateral breast reduction with liposuction, scheduled for 11/05/2019 with Dr. Ulice Bold.  Summary from previous visit: Patient has extremely large breasts.  The left is likely 200 g larger than the right.  The sternal to nipple distance on the right is 21 cm and the left is 23 cm.  IMF distance is 11 cm.  She is 5 feet 3 inches tall and weighs 130 pounds.  Preoperative bra size is 34 DD.  The excess estimated breast tissue to be removed at the time of surgery is 300 g from each side.  Patient reports she would like to be a B cup.  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  PMH Significant for: none  The patient has not had problems with anesthesia. Has only had wisdom teeth out.  Past Medical History: Allergies: No Known Allergies  Current Medications:  Current Outpatient Medications:  .  acetaminophen (TYLENOL) 500 MG tablet, Take 1 tablet (500 mg total) by mouth every 6 (six) hours as needed. For use AFTER surgery, Disp: 30 tablet, Rfl: 0 .  cephALEXin (KEFLEX) 500 MG capsule, Take 1 capsule (500 mg total) by mouth 4 (four) times daily for 3 days. For use AFTER Surgery, Disp: 12 capsule, Rfl: 0 .  HYDROcodone-acetaminophen (NORCO) 5-325 MG tablet, Take 1 tablet by mouth every 8 (eight) hours as needed for up to 7 days for severe pain. For use AFTER Surgery, Disp: 21 tablet, Rfl: 0 .  ibuprofen (ADVIL) 600 MG tablet, Take 1 tablet (600 mg total) by mouth every 6 (six) hours as needed for mild pain or moderate pain. For use AFTER surgery, Disp: 30 tablet, Rfl:  0 .  ondansetron (ZOFRAN) 4 MG tablet, Take 1 tablet (4 mg total) by mouth every 8 (eight) hours as needed for nausea or vomiting., Disp: 20 tablet, Rfl: 0  Past Medical Problems: History reviewed. No pertinent past medical history.  Past Surgical History: History reviewed. No pertinent surgical history.  Social History: Social History   Socioeconomic History  . Marital status: Married    Spouse name: Not on file  . Number of children: Not on file  . Years of education: Not on file  . Highest education level: Not on file  Occupational History  . Not on file  Tobacco Use  . Smoking status: Never Smoker  . Smokeless tobacco: Never Used  Substance and Sexual Activity  . Alcohol use: Not on file  . Drug use: Not on file  . Sexual activity: Not on file  Other Topics Concern  . Not on file  Social History Narrative  . Not on file   Social Determinants of Health   Financial Resource Strain:   . Difficulty of Paying Living Expenses: Not on file  Food Insecurity:   . Worried About Programme researcher, broadcasting/film/video in the Last Year: Not on file  . Ran Out of Food in the Last Year: Not on file  Transportation Needs:   . Lack of Transportation (Medical): Not  on file  . Lack of Transportation (Non-Medical): Not on file  Physical Activity:   . Days of Exercise per Week: Not on file  . Minutes of Exercise per Session: Not on file  Stress:   . Feeling of Stress : Not on file  Social Connections:   . Frequency of Communication with Friends and Family: Not on file  . Frequency of Social Gatherings with Friends and Family: Not on file  . Attends Religious Services: Not on file  . Active Member of Clubs or Organizations: Not on file  . Attends Banker Meetings: Not on file  . Marital Status: Not on file  Intimate Partner Violence:   . Fear of Current or Ex-Partner: Not on file  . Emotionally Abused: Not on file  . Physically Abused: Not on file  . Sexually Abused: Not on file     Family History: History reviewed. No pertinent family history.  Review of Systems: ROS  Physical Exam: Vital Signs BP 118/79 (BP Location: Left Arm, Patient Position: Sitting, Cuff Size: Normal)   Pulse 74   Temp 97.7 F (36.5 C) (Oral)   Ht 5\' 4"  (1.626 m)   Wt 136 lb 9.6 oz (62 kg)   LMP 09/27/2019 (Exact Date)   SpO2 100%   BMI 23.45 kg/m  Physical Exam  Assessment/Plan:  Ms. Genin scheduled for bilateral breast reduction with liposuction with Dr. Ulice Bold.  Risks, benefits, and alternatives of procedure discussed, questions answered and consent obtained.    Smoking Status: non-smoker; Counseling Given? N/A Last Mammogram: 06/20/2019; Results: No findings suspicious for malignancy  Caprini Score: 2 Low; Risk Factors include: 29 year old female, BMI < 25, and length of planned surgery. Recommendation for mechanical or pharmacological prophylaxis during surgery. Encourage early ambulation.   Pictures obtained: 06/18/2019  Post-op Rx sent to pharmacy: Norco, Keflex, Zofran, Ibuprofen 600 mg, Tylenol 500 mg.  Patient was provided with the breast reduction risks General Surgical Risk consent document and Pain Medication Agreement prior to their appointment.  They had adequate time to read through the risk consent documents and Pain Medication Agreement. We also discussed them in person together during this preop appointment. All of their questions were answered to their satisfaction.  Recommended calling if they have any further questions.  Risk consent form and Pain Medication Agreement to be scanned into patient's chart.  The risk that can be encountered with breast reduction were discussed and include the following but not limited to these:  Breast asymmetry, fluid accumulation, firmness of the breast, inability to breast feed, loss of nipple or areola, skin loss, decrease or no nipple sensation, fat necrosis of the breast tissue, bleeding, infection, healing delay.  There are  risks of anesthesia, changes to skin sensation and injury to nerves or blood vessels.  The muscle can be temporarily or permanently injured.  You may have an allergic reaction to tape, suture, glue, blood products which can result in skin discoloration, swelling, pain, skin lesions, poor healing.  Any of these can lead to the need for revisonal surgery or stage procedures.  A reduction has potential to interfere with diagnostic procedures.  Nipple or breast piercing can increase risks of infection.  This procedure is best done when the breast is fully developed.  Changes in the breast will continue to occur over time.  Pregnancy can alter the outcomes of previous breast reduction surgery, weight gain and weigh loss can also effect the long term appearance.   The risks that can be  encountered with and after liposuction were discussed and include the following but no limited to these:  Asymmetry, fluid accumulation, firmness of the area, fat necrosis with death of fat tissue, bleeding, infection, delayed healing, anesthesia risks, skin sensation changes, injury to structures including nerves, blood vessels, and muscles which may be temporary or permanent, allergies to tape, suture materials and glues, blood products, topical preparations or injected agents, skin and contour irregularities, skin discoloration and swelling, deep vein thrombosis, cardiac and pulmonary complications, pain, which may persist, persistent pain, recurrence of the lesion, poor healing of the incision, possible need for revisional surgery or staged procedures. Thiere can also be persistent swelling, poor wound healing, rippling or loose skin, worsening of cellulite, swelling, and thermal burn or heat injury from ultrasound with the ultrasound-assisted lipoplasty technique. Any change in weight fluctuations can alter the outcome.  The 21st Century Cures Act was signed into law in 2016 which includes the topic of electronic health records.   This provides immediate access to information in MyChart.  This includes consultation notes, operative notes, office notes, lab results and pathology reports.  If you have any questions about what you read please let us know at your next visit or call us at the office.  We are right here with you.   Electronically signed by: Eldridge Abrahams, PA-C 10/17/2019 1:13 PM

## 2019-10-15 NOTE — H&P (View-Only) (Signed)
ICD-10-CM   1. Symptomatic mammary hypertrophy  N62   2. Chronic bilateral thoracic back pain  M54.6    G89.29   3. Neck pain  M54.2       Patient ID: Kelsey Barnett, female    DOB: 1991-01-11, 29 y.o.   MRN: [id number]   History of Present Illness: Kelsey Barnett is a 29 y.o.  female  with a history of symptomatic mammary hyperplasia.  She presents for preoperative evaluation for upcoming procedure, bilateral breast reduction with liposuction, scheduled for 11/05/2019 with Dr. Ulice Bold.  Summary from previous visit: Patient has extremely large breasts.  The left is likely 200 g larger than the right.  The sternal to nipple distance on the right is 21 cm and the left is 23 cm.  IMF distance is 11 cm.  She is 5 feet 3 inches tall and weighs 130 pounds.  Preoperative bra size is 34 DD.  The excess estimated breast tissue to be removed at the time of surgery is 300 g from each side.  Patient reports she would like to be a B cup.  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  PMH Significant for: none  The patient has not had problems with anesthesia. Has only had wisdom teeth out.  Past Medical History: Allergies: No Known Allergies  Current Medications:  Current Outpatient Medications:  .  acetaminophen (TYLENOL) 500 MG tablet, Take 1 tablet (500 mg total) by mouth every 6 (six) hours as needed. For use AFTER surgery, Disp: 30 tablet, Rfl: 0 .  cephALEXin (KEFLEX) 500 MG capsule, Take 1 capsule (500 mg total) by mouth 4 (four) times daily for 3 days. For use AFTER Surgery, Disp: 12 capsule, Rfl: 0 .  HYDROcodone-acetaminophen (NORCO) 5-325 MG tablet, Take 1 tablet by mouth every 8 (eight) hours as needed for up to 7 days for severe pain. For use AFTER Surgery, Disp: 21 tablet, Rfl: 0 .  ibuprofen (ADVIL) 600 MG tablet, Take 1 tablet (600 mg total) by mouth every 6 (six) hours as needed for mild pain or moderate pain. For use AFTER surgery, Disp: 30 tablet, Rfl:  0 .  ondansetron (ZOFRAN) 4 MG tablet, Take 1 tablet (4 mg total) by mouth every 8 (eight) hours as needed for nausea or vomiting., Disp: 20 tablet, Rfl: 0  Past Medical Problems: History reviewed. No pertinent past medical history.  Past Surgical History: History reviewed. No pertinent surgical history.  Social History: Social History   Socioeconomic History  . Marital status: Married    Spouse name: Not on file  . Number of children: Not on file  . Years of education: Not on file  . Highest education level: Not on file  Occupational History  . Not on file  Tobacco Use  . Smoking status: Never Smoker  . Smokeless tobacco: Never Used  Substance and Sexual Activity  . Alcohol use: Not on file  . Drug use: Not on file  . Sexual activity: Not on file  Other Topics Concern  . Not on file  Social History Narrative  . Not on file   Social Determinants of Health   Financial Resource Strain:   . Difficulty of Paying Living Expenses: Not on file  Food Insecurity:   . Worried About Programme researcher, broadcasting/film/video in the Last Year: Not on file  . Ran Out of Food in the Last Year: Not on file  Transportation Needs:   . Lack of Transportation (Medical): Not  on file  . Lack of Transportation (Non-Medical): Not on file  Physical Activity:   . Days of Exercise per Week: Not on file  . Minutes of Exercise per Session: Not on file  Stress:   . Feeling of Stress : Not on file  Social Connections:   . Frequency of Communication with Friends and Family: Not on file  . Frequency of Social Gatherings with Friends and Family: Not on file  . Attends Religious Services: Not on file  . Active Member of Clubs or Organizations: Not on file  . Attends Banker Meetings: Not on file  . Marital Status: Not on file  Intimate Partner Violence:   . Fear of Current or Ex-Partner: Not on file  . Emotionally Abused: Not on file  . Physically Abused: Not on file  . Sexually Abused: Not on file     Family History: History reviewed. No pertinent family history.  Review of Systems: ROS  Physical Exam: Vital Signs BP 118/79 (BP Location: Left Arm, Patient Position: Sitting, Cuff Size: Normal)   Pulse 74   Temp 97.7 F (36.5 C) (Oral)   Ht 5\' 4"  (1.626 m)   Wt 136 lb 9.6 oz (62 kg)   LMP 09/27/2019 (Exact Date)   SpO2 100%   BMI 23.45 kg/m  Physical Exam  Assessment/Plan:  Ms. Genin scheduled for bilateral breast reduction with liposuction with Dr. Ulice Bold.  Risks, benefits, and alternatives of procedure discussed, questions answered and consent obtained.    Smoking Status: non-smoker; Counseling Given? N/A Last Mammogram: 06/20/2019; Results: No findings suspicious for malignancy  Caprini Score: 2 Low; Risk Factors include: 29 year old female, BMI < 25, and length of planned surgery. Recommendation for mechanical or pharmacological prophylaxis during surgery. Encourage early ambulation.   Pictures obtained: 06/18/2019  Post-op Rx sent to pharmacy: Norco, Keflex, Zofran, Ibuprofen 600 mg, Tylenol 500 mg.  Patient was provided with the breast reduction risks General Surgical Risk consent document and Pain Medication Agreement prior to their appointment.  They had adequate time to read through the risk consent documents and Pain Medication Agreement. We also discussed them in person together during this preop appointment. All of their questions were answered to their satisfaction.  Recommended calling if they have any further questions.  Risk consent form and Pain Medication Agreement to be scanned into patient's chart.  The risk that can be encountered with breast reduction were discussed and include the following but not limited to these:  Breast asymmetry, fluid accumulation, firmness of the breast, inability to breast feed, loss of nipple or areola, skin loss, decrease or no nipple sensation, fat necrosis of the breast tissue, bleeding, infection, healing delay.  There are  risks of anesthesia, changes to skin sensation and injury to nerves or blood vessels.  The muscle can be temporarily or permanently injured.  You may have an allergic reaction to tape, suture, glue, blood products which can result in skin discoloration, swelling, pain, skin lesions, poor healing.  Any of these can lead to the need for revisonal surgery or stage procedures.  A reduction has potential to interfere with diagnostic procedures.  Nipple or breast piercing can increase risks of infection.  This procedure is best done when the breast is fully developed.  Changes in the breast will continue to occur over time.  Pregnancy can alter the outcomes of previous breast reduction surgery, weight gain and weigh loss can also effect the long term appearance.   The risks that can be  encountered with and after liposuction were discussed and include the following but no limited to these:  Asymmetry, fluid accumulation, firmness of the area, fat necrosis with death of fat tissue, bleeding, infection, delayed healing, anesthesia risks, skin sensation changes, injury to structures including nerves, blood vessels, and muscles which may be temporary or permanent, allergies to tape, suture materials and glues, blood products, topical preparations or injected agents, skin and contour irregularities, skin discoloration and swelling, deep vein thrombosis, cardiac and pulmonary complications, pain, which may persist, persistent pain, recurrence of the lesion, poor healing of the incision, possible need for revisional surgery or staged procedures. Thiere can also be persistent swelling, poor wound healing, rippling or loose skin, worsening of cellulite, swelling, and thermal burn or heat injury from ultrasound with the ultrasound-assisted lipoplasty technique. Any change in weight fluctuations can alter the outcome.  The 21st Century Cures Act was signed into law in 2016 which includes the topic of electronic health records.   This provides immediate access to information in MyChart.  This includes consultation notes, operative notes, office notes, lab results and pathology reports.  If you have any questions about what you read please let us know at your next visit or call us at the office.  We are right here with you.   Electronically signed by: Eldridge Abrahams, PA-C 10/17/2019 1:13 PM

## 2019-10-17 ENCOUNTER — Encounter: Payer: Self-pay | Admitting: Plastic Surgery

## 2019-10-17 ENCOUNTER — Ambulatory Visit (INDEPENDENT_AMBULATORY_CARE_PROVIDER_SITE_OTHER): Payer: BC Managed Care – PPO | Admitting: Plastic Surgery

## 2019-10-17 ENCOUNTER — Other Ambulatory Visit: Payer: Self-pay

## 2019-10-17 VITALS — BP 118/79 | HR 74 | Temp 97.7°F | Ht 64.0 in | Wt 136.6 lb

## 2019-10-17 DIAGNOSIS — N62 Hypertrophy of breast: Secondary | ICD-10-CM

## 2019-10-17 DIAGNOSIS — M542 Cervicalgia: Secondary | ICD-10-CM

## 2019-10-17 DIAGNOSIS — M546 Pain in thoracic spine: Secondary | ICD-10-CM

## 2019-10-17 DIAGNOSIS — G8929 Other chronic pain: Secondary | ICD-10-CM

## 2019-10-17 MED ORDER — HYDROCODONE-ACETAMINOPHEN 5-325 MG PO TABS
1.0000 | ORAL_TABLET | Freq: Three times a day (TID) | ORAL | 0 refills | Status: AC | PRN
Start: 2019-10-17 — End: 2019-10-24

## 2019-10-17 MED ORDER — IBUPROFEN 600 MG PO TABS
600.0000 mg | ORAL_TABLET | Freq: Four times a day (QID) | ORAL | 0 refills | Status: DC | PRN
Start: 2019-10-17 — End: 2022-05-23

## 2019-10-17 MED ORDER — ONDANSETRON HCL 4 MG PO TABS: 4.0000 mg | ORAL_TABLET | Freq: Three times a day (TID) | ORAL | 0 refills | Status: DC | PRN

## 2019-10-17 MED ORDER — ACETAMINOPHEN 500 MG PO TABS: 500.0000 mg | ORAL_TABLET | Freq: Four times a day (QID) | ORAL | 0 refills | Status: AC | PRN

## 2019-10-17 MED ORDER — CEPHALEXIN 500 MG PO CAPS: 500.0000 mg | ORAL_CAPSULE | Freq: Four times a day (QID) | ORAL | 0 refills | Status: AC

## 2019-10-24 ENCOUNTER — Telehealth: Payer: Self-pay | Admitting: Plastic Surgery

## 2019-10-24 NOTE — Telephone Encounter (Signed)
Patient called to advise her insurance denied the nausea medication and she wants to know if there is anything else she can take. She picked up other prescriptions. She would like to know what to bring with her on the day of surgery. Please call to advise. [phone]

## 2019-10-24 NOTE — Telephone Encounter (Signed)
Returned patients call. Advised her that there is nothing else to take for nausea. [facility] will administer something for nausea in her IV. She can wear loose clothing the day of surgery. The pre-op nurse from [facility] will call her a few days prior to surgery and will go over her medication and what to bring the day of surgery. Patient understood and agreed.

## 2019-10-29 ENCOUNTER — Encounter (HOSPITAL_BASED_OUTPATIENT_CLINIC_OR_DEPARTMENT_OTHER): Payer: Self-pay | Admitting: Plastic Surgery

## 2019-10-29 ENCOUNTER — Other Ambulatory Visit: Payer: Self-pay

## 2019-11-03 ENCOUNTER — Other Ambulatory Visit (HOSPITAL_COMMUNITY)
Admission: RE | Admit: 2019-11-03 | Discharge: 2019-11-03 | Disposition: A | Discharge status: Home / Self Care | Payer: BC Managed Care – PPO | Source: Ambulatory Visit | Attending: Plastic Surgery | Admitting: Plastic Surgery

## 2019-11-03 DIAGNOSIS — Z20822 Contact with and (suspected) exposure to covid-19: Secondary | ICD-10-CM | POA: Insufficient documentation

## 2019-11-03 DIAGNOSIS — Z01812 Encounter for preprocedural laboratory examination: Secondary | ICD-10-CM | POA: Diagnosis not present

## 2019-11-03 LAB — SARS CORONAVIRUS 2 (TAT 6-24 HRS): SARS Coronavirus 2: NEGATIVE

## 2019-11-05 ENCOUNTER — Ambulatory Visit (HOSPITAL_BASED_OUTPATIENT_CLINIC_OR_DEPARTMENT_OTHER)
Admission: RE | Admit: 2019-11-05 | Discharge: 2019-11-05 | Disposition: A | Discharge status: Home / Self Care | Payer: BC Managed Care – PPO | Attending: Plastic Surgery | Admitting: Plastic Surgery

## 2019-11-05 ENCOUNTER — Other Ambulatory Visit: Payer: Self-pay

## 2019-11-05 ENCOUNTER — Encounter (HOSPITAL_BASED_OUTPATIENT_CLINIC_OR_DEPARTMENT_OTHER)
Admission: RE | Disposition: A | Discharge status: Home / Self Care | Payer: Self-pay | Source: Home / Self Care | Attending: Plastic Surgery

## 2019-11-05 ENCOUNTER — Encounter (HOSPITAL_BASED_OUTPATIENT_CLINIC_OR_DEPARTMENT_OTHER): Payer: Self-pay | Admitting: Plastic Surgery

## 2019-11-05 ENCOUNTER — Ambulatory Visit (HOSPITAL_BASED_OUTPATIENT_CLINIC_OR_DEPARTMENT_OTHER): Payer: BC Managed Care – PPO | Admitting: Certified Registered"

## 2019-11-05 DIAGNOSIS — N62 Hypertrophy of breast: Secondary | ICD-10-CM | POA: Insufficient documentation

## 2019-11-05 DIAGNOSIS — M542 Cervicalgia: Secondary | ICD-10-CM | POA: Insufficient documentation

## 2019-11-05 DIAGNOSIS — M546 Pain in thoracic spine: Secondary | ICD-10-CM | POA: Diagnosis not present

## 2019-11-05 DIAGNOSIS — M549 Dorsalgia, unspecified: Secondary | ICD-10-CM | POA: Diagnosis not present

## 2019-11-05 DIAGNOSIS — Z79899 Other long term (current) drug therapy: Secondary | ICD-10-CM | POA: Insufficient documentation

## 2019-11-05 DIAGNOSIS — G8929 Other chronic pain: Secondary | ICD-10-CM | POA: Diagnosis not present

## 2019-11-05 HISTORY — PX: BREAST REDUCTION SURGERY: SHX8

## 2019-11-05 LAB — POCT PREGNANCY, URINE: Preg Test, Ur: NEGATIVE

## 2019-11-05 SURGERY — BREAST REDUCTION WITH LIPOSUCTION
Anesthesia: General | Site: Breast | Laterality: Bilateral

## 2019-11-05 MED ORDER — OXYCODONE HCL 5 MG PO TABS
ORAL_TABLET | ORAL | Status: AC
  Filled 2019-11-05: qty 1

## 2019-11-05 MED ORDER — MEPERIDINE HCL 25 MG/ML IJ SOLN: 6.2500 mg | INTRAMUSCULAR | Status: DC | PRN

## 2019-11-05 MED ORDER — DEXAMETHASONE SODIUM PHOSPHATE 4 MG/ML IJ SOLN
INTRAMUSCULAR | Status: DC | PRN
  Administered 2019-11-05: 5 mg via INTRAVENOUS

## 2019-11-05 MED ORDER — SCOPOLAMINE 1 MG/3DAYS TD PT72
MEDICATED_PATCH | TRANSDERMAL | Status: AC
  Filled 2019-11-05: qty 1

## 2019-11-05 MED ORDER — MORPHINE SULFATE (PF) 4 MG/ML IV SOLN: 2.0000 mg | INTRAVENOUS | Status: DC | PRN

## 2019-11-05 MED ORDER — OXYCODONE HCL 5 MG/5ML PO SOLN: 5.0000 mg | Freq: Once | ORAL | Status: AC | PRN

## 2019-11-05 MED ORDER — PROPOFOL 500 MG/50ML IV EMUL
INTRAVENOUS | Status: DC | PRN
  Administered 2019-11-05: 25 ug/kg/min via INTRAVENOUS

## 2019-11-05 MED ORDER — ACETAMINOPHEN 325 MG PO TABS: 650.0000 mg | ORAL_TABLET | ORAL | Status: DC | PRN

## 2019-11-05 MED ORDER — ACETAMINOPHEN 325 MG RE SUPP: 650.0000 mg | RECTAL | Status: DC | PRN

## 2019-11-05 MED ORDER — MIDAZOLAM HCL 5 MG/5ML IJ SOLN
INTRAMUSCULAR | Status: DC | PRN
  Administered 2019-11-05: 2 mg via INTRAVENOUS

## 2019-11-05 MED ORDER — ROCURONIUM BROMIDE 100 MG/10ML IV SOLN
INTRAVENOUS | Status: DC | PRN
  Administered 2019-11-05: 70 mg via INTRAVENOUS

## 2019-11-05 MED ORDER — PROPOFOL 10 MG/ML IV BOLUS
INTRAVENOUS | Status: DC | PRN
  Administered 2019-11-05: 200 mg via INTRAVENOUS

## 2019-11-05 MED ORDER — CEFAZOLIN SODIUM-DEXTROSE 2-4 GM/100ML-% IV SOLN
INTRAVENOUS | Status: AC
  Filled 2019-11-05: qty 100

## 2019-11-05 MED ORDER — OXYCODONE HCL 5 MG PO TABS
5.0000 mg | ORAL_TABLET | Freq: Once | ORAL | Status: AC | PRN
  Administered 2019-11-05: 5 mg via ORAL

## 2019-11-05 MED ORDER — SODIUM CHLORIDE 0.9% FLUSH: 3.0000 mL | Freq: Two times a day (BID) | INTRAVENOUS | Status: DC

## 2019-11-05 MED ORDER — SODIUM CHLORIDE 0.9 % IV SOLN: 250.0000 mL | INTRAVENOUS | Status: DC | PRN

## 2019-11-05 MED ORDER — GLYCOPYRROLATE 0.2 MG/ML IJ SOLN
INTRAMUSCULAR | Status: DC | PRN
  Administered 2019-11-05: .4 mg via INTRAVENOUS

## 2019-11-05 MED ORDER — HYDROMORPHONE HCL 1 MG/ML IJ SOLN: 0.2500 mg | INTRAMUSCULAR | Status: DC | PRN

## 2019-11-05 MED ORDER — SCOPOLAMINE 1 MG/3DAYS TD PT72
1.0000 | MEDICATED_PATCH | TRANSDERMAL | Status: DC
  Administered 2019-11-05: 1.5 mg via TRANSDERMAL

## 2019-11-05 MED ORDER — LIDOCAINE HCL 1 % IJ SOLN
INTRAVENOUS | Status: DC | PRN
  Administered 2019-11-05: 500 mL

## 2019-11-05 MED ORDER — OXYCODONE HCL 5 MG PO TABS: 5.0000 mg | ORAL_TABLET | ORAL | Status: DC | PRN

## 2019-11-05 MED ORDER — MIDAZOLAM HCL 2 MG/2ML IJ SOLN
INTRAMUSCULAR | Status: AC
  Filled 2019-11-05: qty 2

## 2019-11-05 MED ORDER — FENTANYL CITRATE (PF) 100 MCG/2ML IJ SOLN
INTRAMUSCULAR | Status: DC | PRN
Start: 2019-11-05 — End: 2019-11-05
  Administered 2019-11-05: 100 ug via INTRAVENOUS
  Administered 2019-11-05 (×3): 50 ug via INTRAVENOUS

## 2019-11-05 MED ORDER — ONDANSETRON HCL 4 MG/2ML IJ SOLN
INTRAMUSCULAR | Status: DC | PRN
  Administered 2019-11-05: 4 mg via INTRAVENOUS

## 2019-11-05 MED ORDER — FENTANYL CITRATE (PF) 100 MCG/2ML IJ SOLN
INTRAMUSCULAR | Status: AC
  Filled 2019-11-05: qty 2

## 2019-11-05 MED ORDER — SUGAMMADEX SODIUM 500 MG/5ML IV SOLN
INTRAVENOUS | Status: AC
  Filled 2019-11-05: qty 10

## 2019-11-05 MED ORDER — ACETAMINOPHEN 500 MG PO TABS
ORAL_TABLET | ORAL | Status: AC
  Filled 2019-11-05: qty 2

## 2019-11-05 MED ORDER — CHLORHEXIDINE GLUCONATE CLOTH 2 % EX PADS: 6.0000 | MEDICATED_PAD | Freq: Once | CUTANEOUS | Status: DC

## 2019-11-05 MED ORDER — LIDOCAINE HCL (CARDIAC) PF 100 MG/5ML IV SOSY
PREFILLED_SYRINGE | INTRAVENOUS | Status: DC | PRN
  Administered 2019-11-05: 60 mg via INTRAVENOUS

## 2019-11-05 MED ORDER — LACTATED RINGERS IV SOLN: INTRAVENOUS | Status: DC

## 2019-11-05 MED ORDER — CEFAZOLIN SODIUM-DEXTROSE 2-4 GM/100ML-% IV SOLN
2.0000 g | INTRAVENOUS | Status: AC
  Administered 2019-11-05: 2 g via INTRAVENOUS

## 2019-11-05 MED ORDER — PROMETHAZINE HCL 25 MG/ML IJ SOLN: 6.2500 mg | INTRAMUSCULAR | Status: DC | PRN

## 2019-11-05 MED ORDER — LIDOCAINE-EPINEPHRINE 1 %-1:100000 IJ SOLN
INTRAMUSCULAR | Status: DC | PRN
  Administered 2019-11-05: 20 mL

## 2019-11-05 MED ORDER — ACETAMINOPHEN 500 MG PO TABS
1000.0000 mg | ORAL_TABLET | Freq: Once | ORAL | Status: AC
  Administered 2019-11-05: 1000 mg via ORAL

## 2019-11-05 MED ORDER — DIPHENHYDRAMINE HCL 50 MG/ML IJ SOLN
INTRAMUSCULAR | Status: DC | PRN
  Administered 2019-11-05: 12.5 mg via INTRAVENOUS

## 2019-11-05 MED ORDER — PHENYLEPHRINE HCL (PRESSORS) 10 MG/ML IV SOLN
INTRAVENOUS | Status: DC | PRN
  Administered 2019-11-05: 80 ug via INTRAVENOUS

## 2019-11-05 MED ORDER — NEOSTIGMINE METHYLSULFATE 10 MG/10ML IV SOLN
INTRAVENOUS | Status: DC | PRN
  Administered 2019-11-05: 4 mg via INTRAVENOUS

## 2019-11-05 MED ORDER — MIDAZOLAM HCL 2 MG/2ML IJ SOLN: 0.5000 mg | Freq: Once | INTRAMUSCULAR | Status: DC | PRN

## 2019-11-05 MED ORDER — SODIUM CHLORIDE 0.9% FLUSH: 3.0000 mL | INTRAVENOUS | Status: DC | PRN

## 2019-11-05 SURGICAL SUPPLY — 73 items
ADH SKN CLS APL DERMABOND .7 (GAUZE/BANDAGES/DRESSINGS) ×2
BAG DECANTER FOR FLEXI CONT (MISCELLANEOUS) IMPLANT
BINDER BREAST LRG (GAUZE/BANDAGES/DRESSINGS) ×3 IMPLANT
BINDER BREAST MEDIUM (GAUZE/BANDAGES/DRESSINGS) IMPLANT
BINDER BREAST XLRG (GAUZE/BANDAGES/DRESSINGS) IMPLANT
BINDER BREAST XXLRG (GAUZE/BANDAGES/DRESSINGS) IMPLANT
BIOPATCH RED 1 DISK 7.0 (GAUZE/BANDAGES/DRESSINGS) IMPLANT
BIOPATCH RED 1IN DISK 7.0MM (GAUZE/BANDAGES/DRESSINGS)
BLADE HEX COATED 2.75 (ELECTRODE) ×3 IMPLANT
BLADE KNIFE PERSONA 10 (BLADE) ×9 IMPLANT
BLADE SURG 15 STRL LF DISP TIS (BLADE) ×1 IMPLANT
BLADE SURG 15 STRL SS (BLADE) ×3
BNDG GAUZE ELAST 4 BULKY (GAUZE/BANDAGES/DRESSINGS) IMPLANT
CANISTER SUCT 1200ML W/VALVE (MISCELLANEOUS) ×3 IMPLANT
COVER BACK TABLE 60X90IN (DRAPES) ×3 IMPLANT
COVER MAYO STAND STRL (DRAPES) ×3 IMPLANT
COVER WAND RF STERILE (DRAPES) IMPLANT
DECANTER SPIKE VIAL GLASS SM (MISCELLANEOUS) IMPLANT
DERMABOND ADVANCED (GAUZE/BANDAGES/DRESSINGS) ×4
DERMABOND ADVANCED .7 DNX12 (GAUZE/BANDAGES/DRESSINGS) ×2 IMPLANT
DRAIN CHANNEL 19F RND (DRAIN) IMPLANT
DRAPE LAPAROSCOPIC ABDOMINAL (DRAPES) ×3 IMPLANT
DRSG OPSITE POSTOP 4X6 (GAUZE/BANDAGES/DRESSINGS) ×6 IMPLANT
DRSG PAD ABDOMINAL 8X10 ST (GAUZE/BANDAGES/DRESSINGS) ×6 IMPLANT
ELECT BLADE 4.0 EZ CLEAN MEGAD (MISCELLANEOUS) ×3
ELECT REM PT RETURN 9FT ADLT (ELECTROSURGICAL) ×3
ELECTRODE BLDE 4.0 EZ CLN MEGD (MISCELLANEOUS) ×1 IMPLANT
ELECTRODE REM PT RTRN 9FT ADLT (ELECTROSURGICAL) ×1 IMPLANT
EVACUATOR SILICONE 100CC (DRAIN) IMPLANT
GLOVE BIO SURGEON STRL SZ 6.5 (GLOVE) ×6 IMPLANT
GLOVE BIO SURGEON STRL SZ7.5 (GLOVE) ×3 IMPLANT
GLOVE BIO SURGEONS STRL SZ 6.5 (GLOVE) ×3
GLOVE BIOGEL PI IND STRL 7.0 (GLOVE) ×2 IMPLANT
GLOVE BIOGEL PI INDICATOR 7.0 (GLOVE) ×4
GLOVE ECLIPSE 6.5 STRL STRAW (GLOVE) ×3 IMPLANT
GLOVE SURG SS PI 7.0 STRL IVOR (GLOVE) ×3 IMPLANT
GOWN STRL REUS W/ TWL LRG LVL3 (GOWN DISPOSABLE) ×4 IMPLANT
GOWN STRL REUS W/TWL LRG LVL3 (GOWN DISPOSABLE) ×12
NDL SAFETY ECLIPSE 18X1.5 (NEEDLE) ×1 IMPLANT
NEEDLE HYPO 18GX1.5 SHARP (NEEDLE) ×3
NEEDLE HYPO 25X1 1.5 SAFETY (NEEDLE) ×3 IMPLANT
NS IRRIG 1000ML POUR BTL (IV SOLUTION) ×3 IMPLANT
PACK BASIN DAY SURGERY FS (CUSTOM PROCEDURE TRAY) ×3 IMPLANT
PAD ALCOHOL SWAB (MISCELLANEOUS) IMPLANT
PAD FOAM SILICONE BACKED (GAUZE/BANDAGES/DRESSINGS) IMPLANT
PENCIL SMOKE EVACUATOR (MISCELLANEOUS) ×3 IMPLANT
SLEEVE SCD COMPRESS KNEE MED (MISCELLANEOUS) ×3 IMPLANT
SPONGE LAP 18X18 RF (DISPOSABLE) ×9 IMPLANT
STRIP SUTURE WOUND CLOSURE 1/2 (MISCELLANEOUS) ×6 IMPLANT
SUT MNCRL AB 4-0 PS2 18 (SUTURE) ×18 IMPLANT
SUT MON AB 3-0 SH 27 (SUTURE) ×15
SUT MON AB 3-0 SH27 (SUTURE) ×5 IMPLANT
SUT MON AB 5-0 PS2 18 (SUTURE) ×9 IMPLANT
SUT PDS 3-0 CT2 (SUTURE)
SUT PDS AB 2-0 CT2 27 (SUTURE) IMPLANT
SUT PDS II 3-0 CT2 27 ABS (SUTURE) IMPLANT
SUT SILK 3 0 PS 1 (SUTURE) IMPLANT
SUT VIC AB 3-0 SH 27 (SUTURE)
SUT VIC AB 3-0 SH 27X BRD (SUTURE) IMPLANT
SUT VICRYL 4-0 PS2 18IN ABS (SUTURE) IMPLANT
SYR 3ML 23GX1 SAFETY (SYRINGE) ×3 IMPLANT
SYR 50ML LL SCALE MARK (SYRINGE) ×3 IMPLANT
SYR BULB IRRIG 60ML STRL (SYRINGE) IMPLANT
SYR CONTROL 10ML LL (SYRINGE) ×3 IMPLANT
TAPE MEASURE VINYL STERILE (MISCELLANEOUS) IMPLANT
TOWEL GREEN STERILE FF (TOWEL DISPOSABLE) ×6 IMPLANT
TRAY DSU PREP LF (CUSTOM PROCEDURE TRAY) ×3 IMPLANT
TUBE CONNECTING 20'X1/4 (TUBING) ×1
TUBE CONNECTING 20X1/4 (TUBING) ×2 IMPLANT
TUBING INFILTRATION IT-10001 (TUBING) ×3 IMPLANT
TUBING SET GRADUATE ASPIR 12FT (MISCELLANEOUS) ×3 IMPLANT
UNDERPAD 30X36 HEAVY ABSORB (UNDERPADS AND DIAPERS) ×6 IMPLANT
YANKAUER SUCT BULB TIP NO VENT (SUCTIONS) ×3 IMPLANT

## 2019-11-05 NOTE — Op Note (Addendum)
Breast Reduction Op note:    DATE OF PROCEDURE: 11/05/2019  LOCATION: [facility]  SURGEON: Alan Ripper Sanger Delvonte Berenson, DO  ASSISTANT: Keenan Bachelor, PA  PREOPERATIVE DIAGNOSIS 1. Macromastia 2. Neck Pain 3. Back Pain  POSTOPERATIVE DIAGNOSIS 1. Macromastia 2. Neck Pain 3. Back Pain  PROCEDURES 1. Bilateral breast reduction.  Right reduction 350 g, Left reduction 464 g  COMPLICATIONS: None.  DRAINS: none  INDICATIONS FOR PROCEDURE Kelsey Barnett is a 29 y.o. year-old female born on Jul 04, 1990,with a history of symptomatic macromastia with concominant back pain, neck pain, shoulder grooving from her bra.   MRN: [id number]  CONSENT Informed consent was obtained directly from the patient. The risks, benefits and alternatives were fully discussed. Specific risks including but not limited to bleeding, infection, hematoma, seroma, scarring, pain, nipple necrosis, asymmetry, poor cosmetic results, and need for further surgery were discussed. The patient had ample opportunity to have her questions answered to her satisfaction.  DESCRIPTION OF PROCEDURE  Patient was brought into the operating room and placed in a supine position.  SCDs were placed and appropriate padding was performed.  Antibiotics were given. The patient underwent general anesthesia and the chest was prepped and draped in a sterile fashion.  A timeout was performed and all information was confirmed to be correct.  Tumescent was infused in the lateral and lower portion of each breast.  Right side: Preoperative markings were confirmed. Liposuction was performed in the lateral and inferior breast.  Incision lines were injected with local with epinephrine.  After waiting for vasoconstriction, the marked lines were incised.  A Wise-pattern superior and superiomedial breast reduction was performed by de-epithelializing the pedicle, using bovie to create the superomedial pedicle, and removing breast tissue  from the lateral and inferior portions of the breast.  Care was taken to not undermine the breast pedicle. Hemostasis was achieved.  The nipple was gently elevated into position and the soft tissue closed with 4-0 Monocryl.   The pocket was irrigated and hemostasis confirmed.  The deep tissues were approximated with 3-0 Monocryl sutures and the skin was closed with deep dermal and subcuticular 4-0 Monocryl sutures.  The nipple and skin flaps had good capillary refill at the end of the procedure.    Left side: Preoperative markings were confirmed. Liposuction was performed in the lateral and inferior portion of the breast. Incision lines were injected with local with epinephrine.  After waiting for vasoconstriction, the marked lines were incised.  A Wise-pattern superomedial breast reduction was performed by de-epithelializing the pedicle, using bovie to create the superomedial pedicle, and removing breast tissue from the superior, lateral, and inferior portions of the breast.  Care was taken to not undermine the breast pedicle. Hemostasis was achieved.  The nipple was gently rotated into position and the soft tissue was closed with 4-0 Monocryl.  The patient was sat upright and size and shape symmetry was confirmed.  The pocket was irrigated and hemostasis confirmed.  The deep tissues were approximated with 3-0 Monocryl sutures and the skin was closed with deep dermal and subcuticular 4-0 Monocryl sutures.  Dermabond was applied.  A breast binder and ABDs were placed.  The nipple and skin flaps had good capillary refill at the end of the procedure.  The patient tolerated the procedure well. The patient was allowed to wake from anesthesia and taken to the recovery room in satisfactory condition.  The advanced practice practitioner (APP) assisted throughout the case.  The APP was essential in retraction and  counter traction when needed to make the case progress smoothly.  This retraction and assistance made it  possible to see the tissue plans for the procedure.  The assistance was needed for blood control, tissue re-approximation and assisted with closure of the incision site.

## 2019-11-05 NOTE — Transfer of Care (Signed)
Immediate Anesthesia Transfer of Care Note  Patient: Kelsey Barnett  Procedure(s) Performed: BILATERAL BREAST REDUCTION WITH LIPOSUCTION (Bilateral Breast)  Patient Location: PACU  Anesthesia Type:General  Level of Consciousness: awake, alert , oriented and patient cooperative  Airway & Oxygen Therapy: Patient Spontanous Breathing and Patient connected to face mask oxygen  Post-op Assessment: Report given to RN and Post -op Vital signs reviewed and stable  Post vital signs: Reviewed and stable  Last Vitals:  Vitals Value Taken Time  BP 113/60 11/05/19 1407  Temp    Pulse 102 11/05/19 1408  Resp 24 11/05/19 1408  SpO2 100 % 11/05/19 1408  Vitals shown include unvalidated device data.  Last Pain:  Vitals:   11/05/19 1104  TempSrc: Oral  PainSc: 0-No pain         Complications: No complications documented.

## 2019-11-05 NOTE — Discharge Instructions (Addendum)
INSTRUCTIONS FOR AFTER SURGERY   You will likely have some questions about what to expect following your operation.  The following information will help you and your family understand what to expect when you are discharged from the hospital.  Following these guidelines will help ensure a smooth recovery and reduce risks of complications.  Postoperative instructions include information on: diet, wound care, medications and physical activity.  AFTER SURGERY Expect to go home after the procedure.  In some cases, you may need to spend one night in the hospital for observation.  DIET This surgery does not require a specific diet.  However, I have to mention that the healthier you eat the better your body can start healing. It is important to increasing your protein intake.  This means limiting the foods with added sugar.  Focus on fruits and vegetables and some meat. It is very important to drink water after your surgery.  If your urine is bright yellow, then it is concentrated, and you need to drink more water.  As a general rule after surgery, you should have 8 ounces of water every hour while awake.  If you find you are persistently nauseated or unable to take in liquids let us know.  NO TOBACCO USE or EXPOSURE.  This will slow your healing process and increase the risk of a wound.  WOUND CARE If you don't have a drain: You can shower the day after surgery.  Use fragrance free soap.  Dial, [place], [place] and Cetaphil are usually mild on the skin.  If you have steri-strips / tape directly attached to your skin leave them in place. It is OK to get these wet.  No baths, pools or hot tubs for two weeks. We close your incision to leave the smallest and best-looking scar. No ointment or creams on your incisions until given the go ahead.  Especially not Neosporin (Too many skin reactions with this one).  A few weeks after surgery you can use Mederma and start massaging the scar. We ask you to wear your binder or  sports bra for the first 6 weeks around the clock, including while sleeping. This provides added comfort and helps reduce the fluid accumulation at the surgery site.  ACTIVITY No heavy lifting until cleared by the doctor.  It is OK to walk and climb stairs. In fact, moving your legs is very important to decrease your risk of a blood clot.  It will also help keep you from getting deconditioned.  Every 1 to 2 hours get up and walk for 5 minutes. This will help with a quicker recovery back to normal.  Let pain be your guide so you don't do too much.  NO, you cannot do the spring cleaning and don't plan on taking care of anyone else.  This is your time for TLC.   WORK Everyone returns to work at different times. As a rough guide, most people take at least 1 - 2 weeks off prior to returning to work. If you need documentation for your job, bring the forms to your postoperative follow up visit.  DRIVING Arrange for someone to bring you home from the hospital.  You may be able to drive a few days after surgery but not while taking any narcotics or valium.  BOWEL MOVEMENTS Constipation can occur after anesthesia and while taking pain medication.  It is important to stay ahead for your comfort.  We recommend taking Milk of Magnesia (2 tablespoons; twice a day) while taking  the pain pills.  SEROMA This is fluid your body tried to put in the surgical site.  This is normal but if it creates excessive pain and swelling let us know.  It usually decreases in a few weeks.  MEDICATIONS and PAIN CONTROL At your preoperative visit for you history and physical you were given the following medications: 1. An antibiotic: Start this medication when you get home and take according to the instructions on the bottle. 2. Zofran 4 mg:  This is to treat nausea and vomiting.  You can take this every 6 hours as needed and only if needed. 3. Norco (hydrocodone/acetaminophen) 5/325 mg:  This is only to be used after you have  taken the motrin or the tylenol. Every 8 hours as needed. Over the counter Medication to take: 4. Ibuprofen (Motrin) 600 mg:  Take this every 6 hours.  If you have additional pain then take 500 mg of the tylenol.  Only take the Norco after you have tried these two. 5. Miralax or stool softener of choice: Take this according to the bottle if you take the Norco.  WHEN TO CALL Call your surgeon's office if any of the following occur: . Fever 101 degrees F or greater . Excessive bleeding or fluid from the incision site. . Pain that increases over time without aid from the medications . Redness, warmth, or pus draining from incision sites . Persistent nausea or inability to take in liquids . Severe misshapen area that underwent the operation.    Post Anesthesia Home Care Instructions  Activity: Get plenty of rest for the remainder of the day. A responsible individual must stay with you for 24 hours following the procedure.  For the next 24 hours, DO NOT: -Drive a car -Advertising copywriter -Drink alcoholic beverages -Take any medication unless instructed by your physician -Make any legal decisions or sign important papers.  Meals: Start with liquid foods such as gelatin or soup. Progress to regular foods as tolerated. Avoid greasy, spicy, heavy foods. If nausea and/or vomiting occur, drink only clear liquids until the nausea and/or vomiting subsides. Call your physician if vomiting continues.  Special Instructions/Symptoms: Your throat may feel dry or sore from the anesthesia or the breathing tube placed in your throat during surgery. If this causes discomfort, gargle with warm salt water. The discomfort should disappear within 24 hours.  If you had a scopolamine patch placed behind your ear for the management of post- operative nausea and/or vomiting:  1. The medication in the patch is effective for 72 hours, after which it should be removed.  Wrap patch in a tissue and discard in the  trash. Wash hands thoroughly with soap and water. 2. You may remove the patch earlier than 72 hours if you experience unpleasant side effects which may include dry mouth, dizziness or visual disturbances. 3. Avoid touching the patch. Wash your hands with soap and water after contact with the patch.    May take tylenol after 5:30pm, if needed.

## 2019-11-05 NOTE — Interval H&P Note (Signed)
History and Physical Interval Note:  11/05/2019 10:47 AM  Kelsey Barnett  has presented today for surgery, with the diagnosis of mammary hypertrophy.  The various methods of treatment have been discussed with the patient and family. After consideration of risks, benefits and other options for treatment, the patient has consented to  Procedure(s): BREAST REDUCTION WITH LIPOSUCTION (Bilateral) as a surgical intervention.  The patient's history has been reviewed, patient examined, no change in status, stable for surgery.  I have reviewed the patient's chart and labs.  Questions were answered to the patient's satisfaction.     Alena Bills Talyah Seder

## 2019-11-05 NOTE — Interval H&P Note (Signed)
History and Physical Interval Note:  11/05/2019 11:02 AM  Kelsey Barnett  has presented today for surgery, with the diagnosis of mammary hypertrophy.  The various methods of treatment have been discussed with the patient and family. After consideration of risks, benefits and other options for treatment, the patient has consented to  Procedure(s): BREAST REDUCTION WITH LIPOSUCTION (Bilateral) as a surgical intervention.  The patient's history has been reviewed, patient examined, no change in status, stable for surgery.  I have reviewed the patient's chart and labs.  Questions were answered to the patient's satisfaction.     Alena Bills Ipek Westra

## 2019-11-05 NOTE — Anesthesia Procedure Notes (Signed)
Procedure Name: Intubation Date/Time: 11/05/2019 11:58 AM Performed by: Signe Colt, CRNA Pre-anesthesia Checklist: Patient identified, Emergency Drugs available, Suction available and Patient being monitored Patient Re-evaluated:Patient Re-evaluated prior to induction Oxygen Delivery Method: Circle system utilized Preoxygenation: Pre-oxygenation with 100% oxygen Induction Type: IV induction Ventilation: Mask ventilation without difficulty Laryngoscope Size: Mac and 3 Grade View: Grade I Tube type: Oral Tube size: 7.0 mm Number of attempts: 1 Airway Equipment and Method: Stylet and Oral airway Placement Confirmation: ETT inserted through vocal cords under direct vision,  positive ETCO2 and breath sounds checked- equal and bilateral Secured at: 21 cm Tube secured with: Tape Dental Injury: Teeth and Oropharynx as per pre-operative assessment

## 2019-11-05 NOTE — Anesthesia Preprocedure Evaluation (Addendum)
Anesthesia Evaluation  Patient identified by MRN, date of birth, ID band Patient awake    Reviewed: Allergy & Precautions, NPO status , Patient's Chart, lab work & pertinent test results  History of Anesthesia Complications Negative for: history of anesthetic complications  Airway Mallampati: I  TM Distance: >3 FB Neck ROM: Full    Dental  (+) Teeth Intact, Dental Advisory Given   Pulmonary neg pulmonary ROS,  11/03/2019 SARS coronavirus NEG   breath sounds clear to auscultation       Cardiovascular negative cardio ROS   Rhythm:Regular Rate:Normal     Neuro/Psych negative neurological ROS     GI/Hepatic negative GI ROS, Neg liver ROS,   Endo/Other  negative endocrine ROS  Renal/GU negative Renal ROS     Musculoskeletal   Abdominal   Peds  Hematology negative hematology ROS (+)   Anesthesia Other Findings   Reproductive/Obstetrics                            Anesthesia Physical Anesthesia Plan  ASA: I  Anesthesia Plan: General   Post-op Pain Management:    Induction: Intravenous  PONV Risk Score and Plan: 3 and Ondansetron, Dexamethasone and Treatment may vary due to age or medical condition  Airway Management Planned: Oral ETT  Additional Equipment: None  Intra-op Plan:   Post-operative Plan: Extubation in OR  Informed Consent: I have reviewed the patients History and Physical, chart, labs and discussed the procedure including the risks, benefits and alternatives for the proposed anesthesia with the patient or authorized representative who has indicated his/her understanding and acceptance.     Dental advisory given  Plan Discussed with: CRNA and Surgeon  Anesthesia Plan Comments:        Anesthesia Quick Evaluation

## 2019-11-05 NOTE — Anesthesia Postprocedure Evaluation (Signed)
Anesthesia Post Note  Patient: Kelsey Barnett  Procedure(s) Performed: BILATERAL BREAST REDUCTION WITH LIPOSUCTION (Bilateral Breast)     Patient location during evaluation: PACU Anesthesia Type: General Level of consciousness: sedated Pain management: pain level controlled Vital Signs Assessment: post-procedure vital signs reviewed and stable Respiratory status: spontaneous breathing and respiratory function stable Cardiovascular status: stable Postop Assessment: no apparent nausea or vomiting Anesthetic complications: no   No complications documented.  Last Vitals:  Vitals:   11/05/19 1415 11/05/19 1430  BP: 116/71 126/82  Pulse: (!) 105 97  Resp: 20 19  Temp:    SpO2: 100% 100%    Last Pain:  Vitals:   11/05/19 1430  TempSrc:   PainSc: 0-No pain                 [name]

## 2019-11-06 LAB — SURGICAL PATHOLOGY

## 2019-11-07 ENCOUNTER — Encounter (HOSPITAL_BASED_OUTPATIENT_CLINIC_OR_DEPARTMENT_OTHER): Payer: Self-pay | Admitting: Plastic Surgery

## 2019-11-13 DIAGNOSIS — Z9889 Other specified postprocedural states: Secondary | ICD-10-CM | POA: Insufficient documentation

## 2019-11-13 NOTE — Progress Notes (Signed)
Subjective:     Patient ID: Kelsey Barnett, female    DOB: 01-26-1991, 29 y.o.   MRN: [id number]  Chief Complaint  Patient presents with  . Post-op Follow-up    HPI: The patient is a 29 y.o. female here for follow-up after undergoing bilateral breast reduction on 11/05/2019 with Dr. Ulice Bold.  (Right reduction: 350 g, left reduction: 464 g)  ~9 days postop Patient reports she is doing very well.  No complaints.  Bilateral incisions are healing nicely, C/D/I.  Honeycomb dressings were removed today.  Steri-Strips in place.  No signs of infection, redness, drainage, seroma/hematoma.  Patient denies fever, chest pain, nausea/vomiting.  Review of Systems  Constitutional: Negative for chills and fever.  Respiratory: Negative for shortness of breath.   Cardiovascular: Negative for chest pain.  Gastrointestinal: Negative for constipation, diarrhea, nausea and vomiting.  Skin: Negative for color change, pallor and rash.     Objective:   Vital Signs BP 107/77 (BP Location: Left Arm, Patient Position: Sitting, Cuff Size: Normal)   Pulse 82   Temp 98.1 F (36.7 C) (Oral)   LMP 10/26/2019 Comment: negative UPT  SpO2 99%  Vital Signs and Nursing Note Reviewed  Physical Exam Constitutional:      General: She is not in acute distress.    Appearance: Normal appearance. She is normal weight. She is not ill-appearing.  HENT:     Head: Normocephalic and atraumatic.  Eyes:     Extraocular Movements: Extraocular movements intact.  Pulmonary:     Effort: Pulmonary effort is normal.  Chest:     Comments: Bilateral incisions are healing nicely, C/D/I.  No signs of infection, redness, drainage, seroma/hematoma.  Honeycomb dressings were removed.  Steri-Strips are in place. Musculoskeletal:        General: Normal range of motion.     Cervical back: Normal range of motion.  Skin:    General: Skin is warm and dry.     Coloration: Skin is not pale.     Findings: No erythema or rash.    Neurological:     Mental Status: She is alert and oriented to person, place, and time.     Gait: Gait is intact.  Psychiatric:        Mood and Affect: Mood and affect normal.        Behavior: Behavior normal.        Thought Content: Thought content normal.        Cognition and Memory: Memory normal.        Judgment: Judgment normal.       Assessment/Plan:     ICD-10-CM   1. S/P bilateral breast reduction  Z98.890    Patient is doing very well.  Incisions are healing nicely, C/D/I.  No signs of infection, redness, drainage, seroma/hematoma.  Continue to wear compression garment 24/7 until 6 weeks postop.  Continue to avoid heavy lifting.  May shower normally, avoid scrubbing or water spray on breast area.  Follow-up 1 to 2 weeks.  Call office with any questions/concerns.  The 21st Century Cures Act was signed into law in 2016 which includes the topic of electronic health records.  This provides immediate access to information in MyChart.  This includes consultation notes, operative notes, office notes, lab results and pathology reports.  If you have any questions about what you read please let us know at your next visit or call us at the office.  We are right here with you.   Loistine Simas  Melba Coon, PA-C 11/14/2019, 12:55 PM

## 2019-11-14 ENCOUNTER — Other Ambulatory Visit: Payer: Self-pay

## 2019-11-14 ENCOUNTER — Ambulatory Visit (INDEPENDENT_AMBULATORY_CARE_PROVIDER_SITE_OTHER): Payer: BC Managed Care – PPO | Admitting: Plastic Surgery

## 2019-11-14 ENCOUNTER — Encounter: Payer: Self-pay | Admitting: Plastic Surgery

## 2019-11-14 VITALS — BP 107/77 | HR 82 | Temp 98.1°F

## 2019-11-14 DIAGNOSIS — Z9889 Other specified postprocedural states: Secondary | ICD-10-CM

## 2019-11-25 ENCOUNTER — Other Ambulatory Visit: Payer: Self-pay

## 2019-11-25 ENCOUNTER — Ambulatory Visit (INDEPENDENT_AMBULATORY_CARE_PROVIDER_SITE_OTHER): Payer: BC Managed Care – PPO | Admitting: Plastic Surgery

## 2019-11-25 ENCOUNTER — Encounter: Payer: Self-pay | Admitting: Plastic Surgery

## 2019-11-25 VITALS — BP 105/71 | HR 81 | Temp 98.1°F

## 2019-11-25 DIAGNOSIS — Z9889 Other specified postprocedural states: Secondary | ICD-10-CM

## 2019-11-25 NOTE — Progress Notes (Signed)
The patient is a 29 year old female here for follow-up on her bilateral breast reduction from 3 weeks ago.  Overall she is very pleased with her results.  She is not having any pain issues and her incisions are healing well.  There is no sign of infection.  There is no sign of hematoma or seroma.  The Steri-Strips are still in place.  I would recommend leaving those until they come off on their own.  She should continue with sports bra and massage.  If at any point she is not happy with the potential bulge at the inframammary fold she should let me know and we can do a revision.  Pictures were obtained of the patient and placed in the chart with the patient's or guardian's permission.

## 2019-12-10 NOTE — Progress Notes (Signed)
Patient is a 29 year old female here for follow-up after undergoing bilateral breast reduction on 11/05/2019 with Dr. Ulice Bold (right reduction: 350 g, left reduction: 464 g).  ~ 5 weeks PO Patient presents today due to a sore area on her rib cage just inferior to the right breast.  Reports otherwise she is doing well.  States the soreness happened after attending a day long wedding.  Upon exam she has some very mild swelling of the area along her ribs directly under the right breast, suspect muscle strain.  Bilateral breast incisions are healing very nicely, C/D/I.  No signs of infection, redness, drainage, seroma/hematoma.  Steri-Strips were removed off the vertical limbs today.  Patient denies fever/chills, nausea/vomiting.  Continue to wear compression garment during the day.  May shower normally.  May resume normal activities gradually as tolerated.  Recommend ice applied to area of soreness along ribs and ibuprofen.  Follow-up in 4 to 5 weeks.  Call office with any questions/concerns or if condition worsens.  Pictures were obtained of the patient and placed in the chart with the patient's or guardian's permission.

## 2019-12-11 ENCOUNTER — Encounter: Payer: Self-pay | Admitting: Plastic Surgery

## 2019-12-11 ENCOUNTER — Ambulatory Visit (INDEPENDENT_AMBULATORY_CARE_PROVIDER_SITE_OTHER): Payer: BC Managed Care – PPO | Admitting: Plastic Surgery

## 2019-12-11 ENCOUNTER — Other Ambulatory Visit: Payer: Self-pay

## 2019-12-11 VITALS — BP 112/76 | HR 77 | Temp 98.4°F

## 2019-12-11 DIAGNOSIS — Z9889 Other specified postprocedural states: Secondary | ICD-10-CM

## 2020-01-11 NOTE — Progress Notes (Signed)
Patient is a 29 year old female here for follow-up after undergoing bilateral breast reduction on 11/05/2019 with Dr. Ulice Bold.  (Right reduction: 350 g, left reduction: 464 g).  ~ 10 weeks PO Patient reports she is doing well.  No concerns.  Denies fever/chills, nausea/vomiting.  Bilateral incisions have healed very nicely, C/D/I.  No signs of infection, redness, drainage, seroma/hematoma.  No palpable areas of fat necrosis.  Patient may resume all normal activities.  May return to wearing a regular bra if desired, recommend no underwire.  Follow-up as needed.  Return precautions discussed.  Call office with any questions/concerns.  Patient is very pleased with her results.  Pictures were obtained of the patient and placed in the chart with the patient's or guardian's permission.

## 2020-01-14 ENCOUNTER — Ambulatory Visit (INDEPENDENT_AMBULATORY_CARE_PROVIDER_SITE_OTHER): Payer: BC Managed Care – PPO | Admitting: Plastic Surgery

## 2020-01-14 ENCOUNTER — Other Ambulatory Visit: Payer: Self-pay

## 2020-01-14 ENCOUNTER — Encounter: Payer: Self-pay | Admitting: Plastic Surgery

## 2020-01-14 VITALS — BP 116/73 | HR 69 | Temp 97.6°F

## 2020-01-14 DIAGNOSIS — Z9889 Other specified postprocedural states: Secondary | ICD-10-CM

## 2020-04-26 DIAGNOSIS — F4323 Adjustment disorder with mixed anxiety and depressed mood: Secondary | ICD-10-CM | POA: Diagnosis not present

## 2020-05-17 DIAGNOSIS — F4323 Adjustment disorder with mixed anxiety and depressed mood: Secondary | ICD-10-CM | POA: Diagnosis not present

## 2020-09-17 DIAGNOSIS — F4323 Adjustment disorder with mixed anxiety and depressed mood: Secondary | ICD-10-CM | POA: Diagnosis not present

## 2020-11-25 DIAGNOSIS — Z6823 Body mass index (BMI) 23.0-23.9, adult: Secondary | ICD-10-CM | POA: Diagnosis not present

## 2020-11-25 DIAGNOSIS — Z01419 Encounter for gynecological examination (general) (routine) without abnormal findings: Secondary | ICD-10-CM | POA: Diagnosis not present

## 2020-11-25 DIAGNOSIS — Z319 Encounter for procreative management, unspecified: Secondary | ICD-10-CM | POA: Diagnosis not present

## 2020-12-13 DIAGNOSIS — F4323 Adjustment disorder with mixed anxiety and depressed mood: Secondary | ICD-10-CM | POA: Diagnosis not present

## 2021-01-17 DIAGNOSIS — F4323 Adjustment disorder with mixed anxiety and depressed mood: Secondary | ICD-10-CM | POA: Diagnosis not present

## 2021-03-21 DIAGNOSIS — Z23 Encounter for immunization: Secondary | ICD-10-CM | POA: Diagnosis not present

## 2021-03-21 DIAGNOSIS — Z0000 Encounter for general adult medical examination without abnormal findings: Secondary | ICD-10-CM | POA: Diagnosis not present

## 2021-03-30 DIAGNOSIS — Z0000 Encounter for general adult medical examination without abnormal findings: Secondary | ICD-10-CM | POA: Diagnosis not present

## 2021-05-11 DIAGNOSIS — H1031 Unspecified acute conjunctivitis, right eye: Secondary | ICD-10-CM | POA: Diagnosis not present

## 2021-06-16 DIAGNOSIS — N9489 Other specified conditions associated with female genital organs and menstrual cycle: Secondary | ICD-10-CM | POA: Diagnosis not present

## 2021-06-16 DIAGNOSIS — N926 Irregular menstruation, unspecified: Secondary | ICD-10-CM | POA: Diagnosis not present

## 2021-07-05 DIAGNOSIS — N926 Irregular menstruation, unspecified: Secondary | ICD-10-CM | POA: Diagnosis not present

## 2021-09-26 DIAGNOSIS — Z3201 Encounter for pregnancy test, result positive: Secondary | ICD-10-CM | POA: Diagnosis not present

## 2021-10-02 IMAGING — MG DIGITAL SCREENING BILAT W/ CAD
4 series · 4 of 4 positions shown · non-contrast
Comparison: Previous exam(s).
COMPARISON: None.

*** End of Addendum ***
COMPARISON: Previous exam(s).

Addendum:
CLINICAL DATA: Screening.

EXAM:
DIGITAL SCREENING BILATERAL MAMMOGRAM WITH CAD

[R CC]
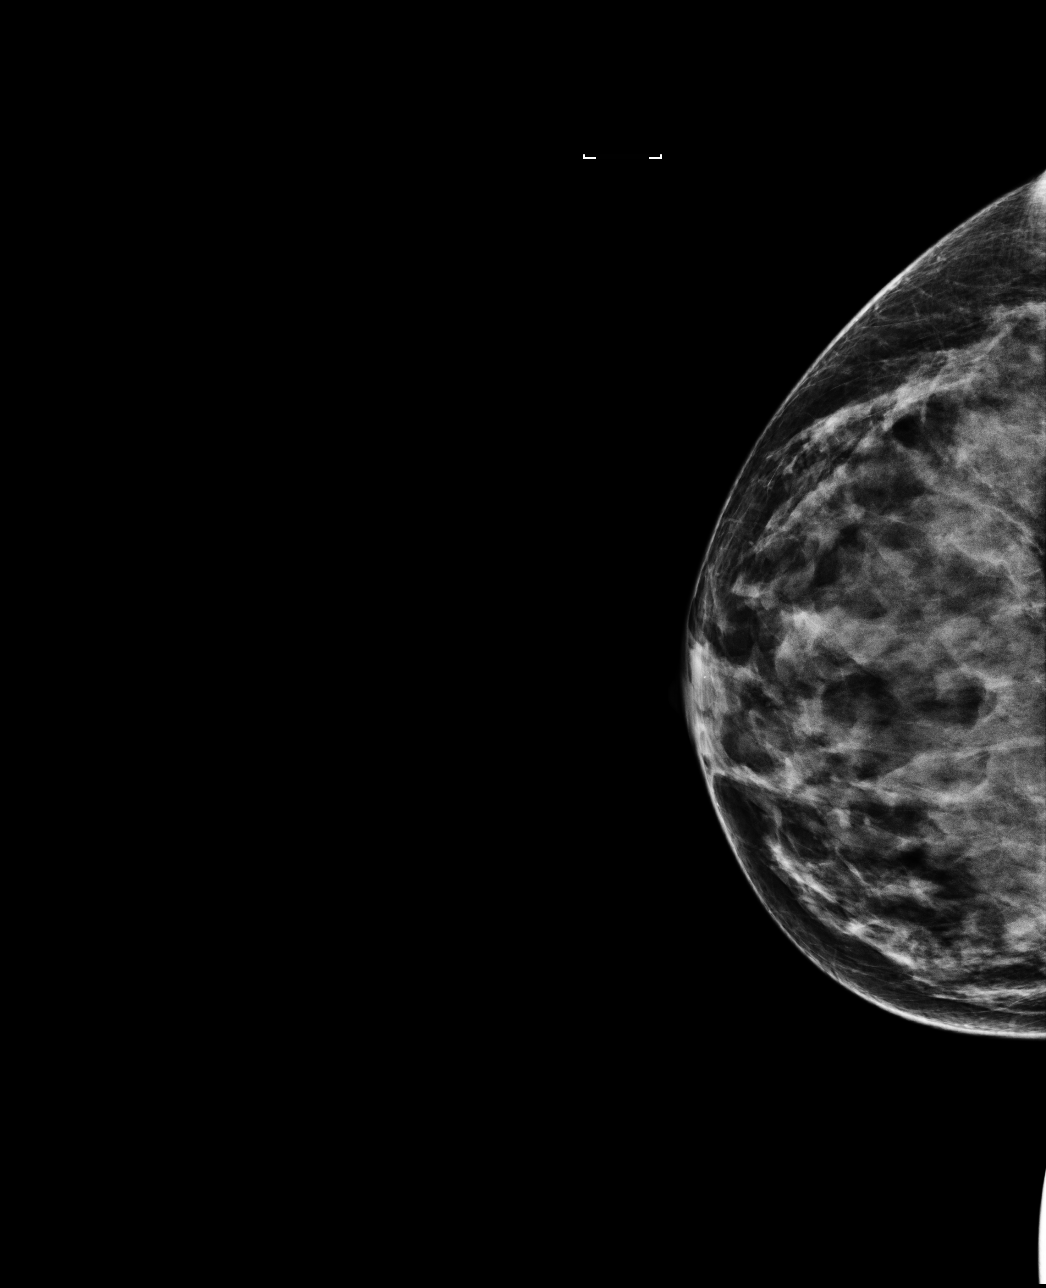

[R MLO]
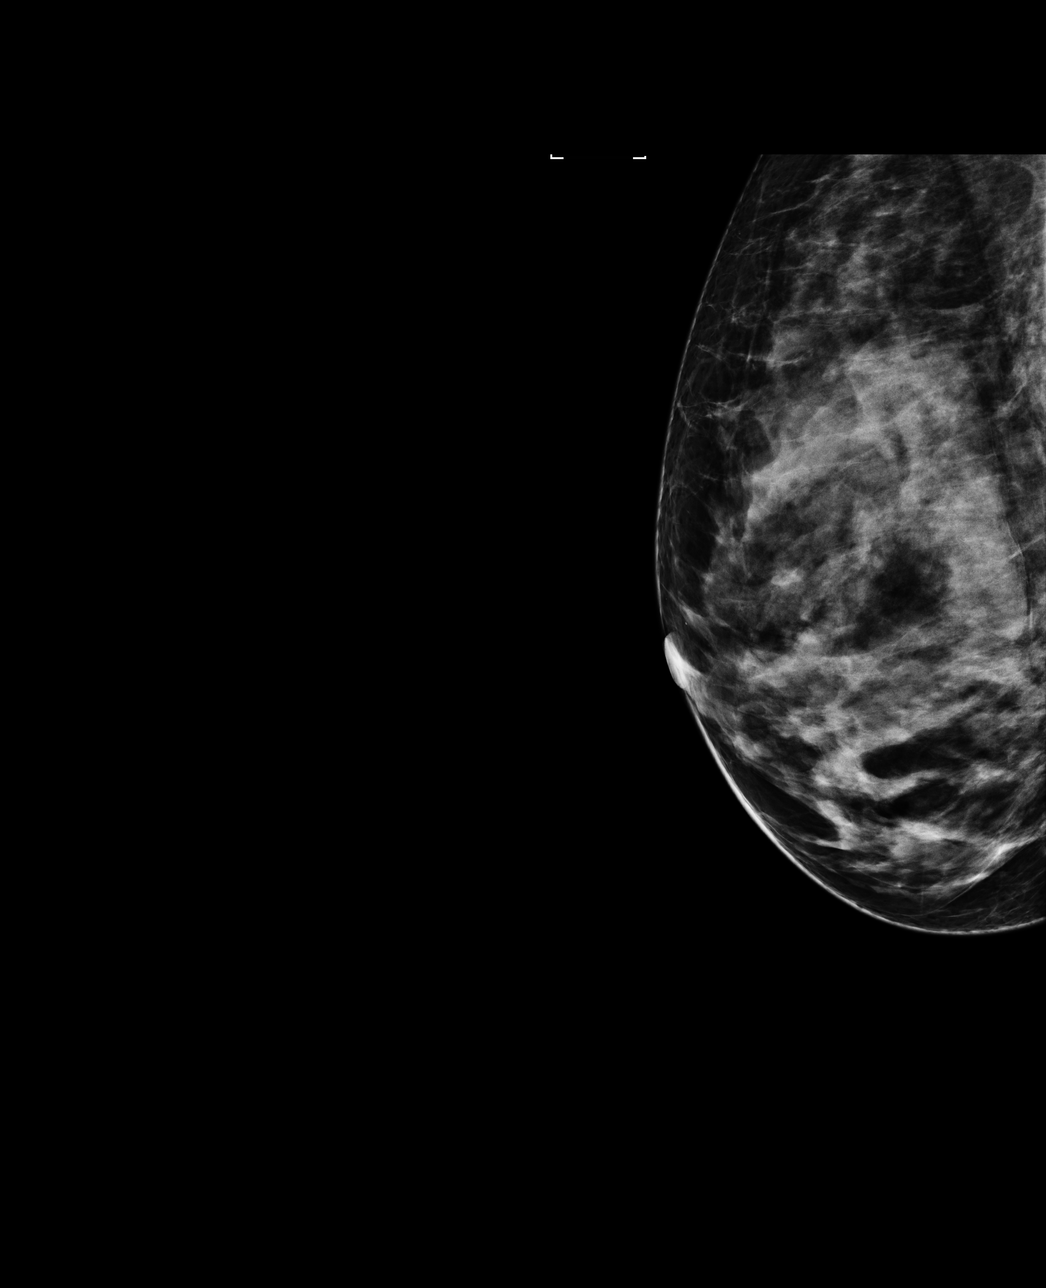

[L MLO]
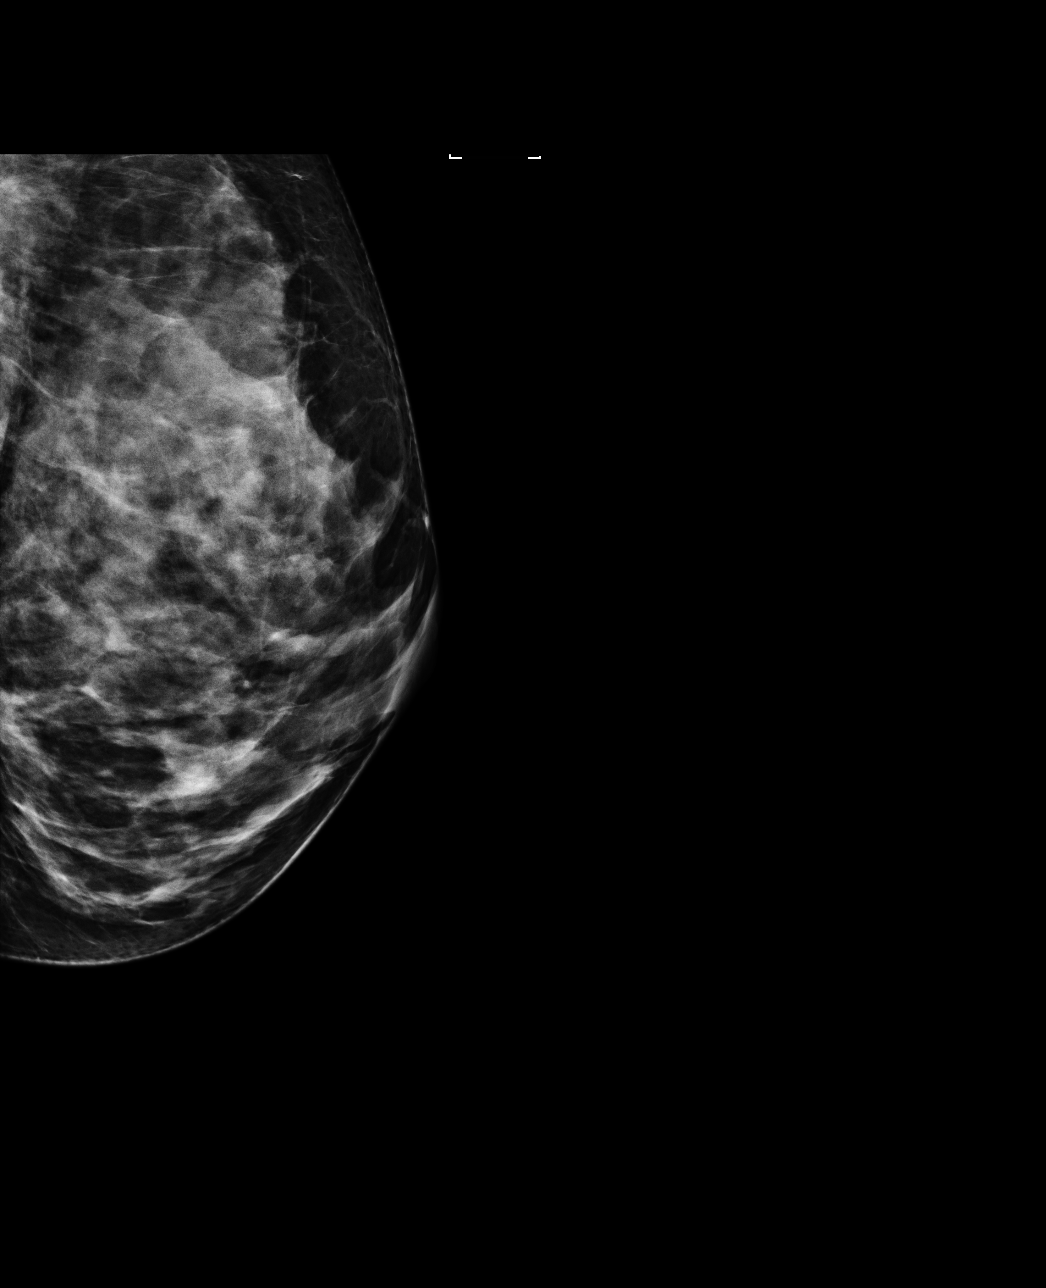

[L CC]
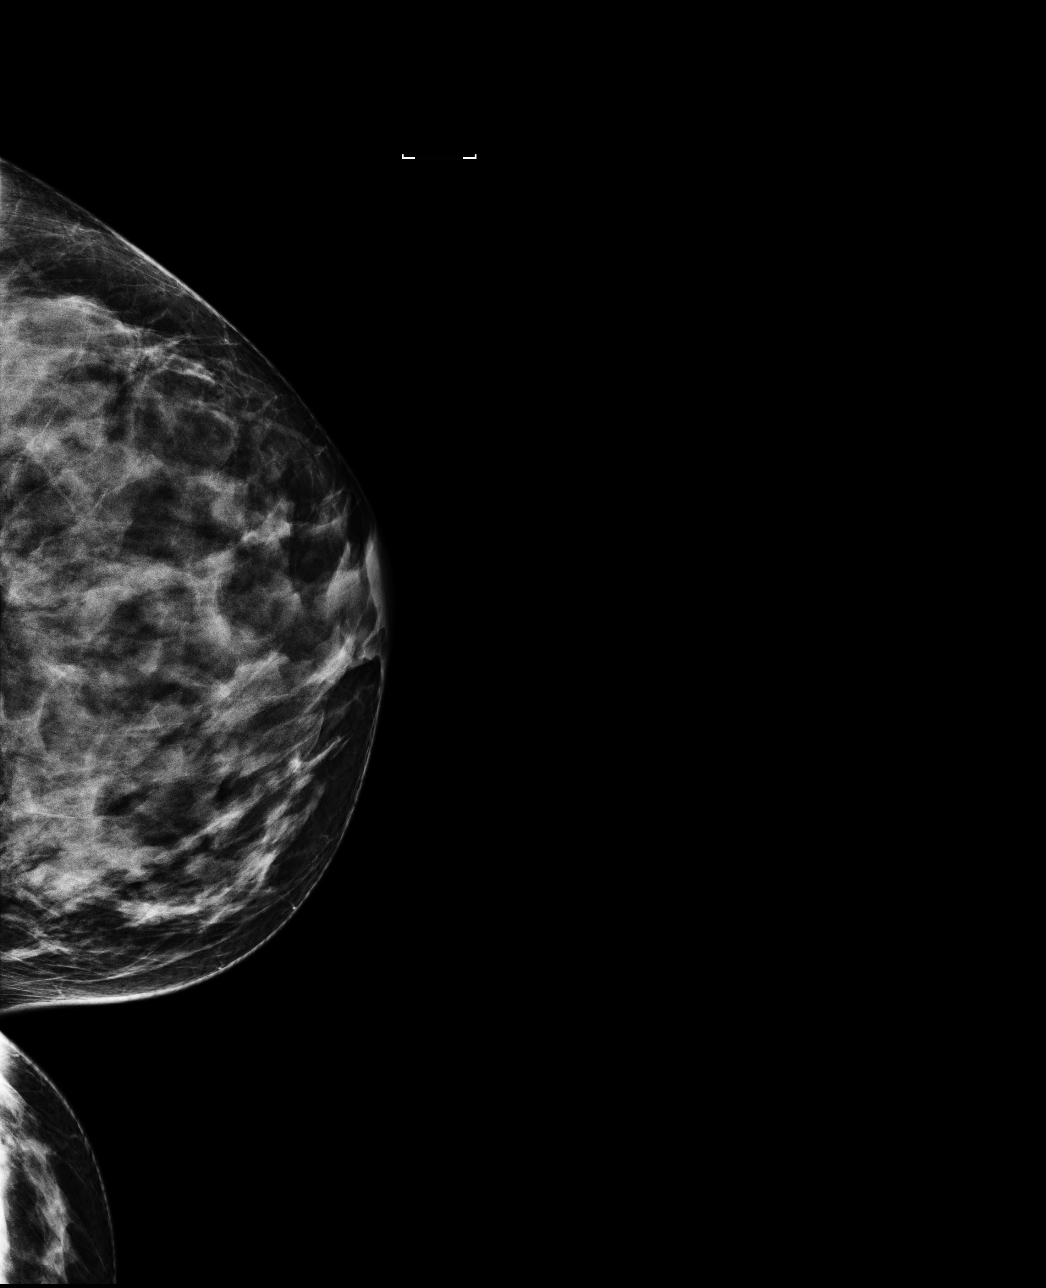

[4 of 4 positions shown; findings below may reference images not displayed]

ACR Breast Density Category d: The breast tissue is extremely dense,
which lowers the sensitivity of mammography
FINDINGS: There are no findings suspicious for malignancy. Images were
processed with CAD.
IMPRESSION: No mammographic evidence of malignancy. A result letter of this
screening mammogram will be mailed directly to the patient.

RECOMMENDATION:
Screening mammogram at age 41. (Code:SQ-3-2ON)

BI-RADS CATEGORY  1: Negative.
ACR Breast Density Category d: The breast tissue is extremely dense,
which lowers the sensitivity of mammography
FINDINGS: There are no findings suspicious for malignancy. Images were
processed with CAD.
IMPRESSION: No mammographic evidence of malignancy. A result letter of this
screening mammogram will be mailed directly to the patient.

RECOMMENDATION:
Screening mammogram at age 41. (Code:SQ-3-2ON)

BI-RADS CATEGORY  1: Negative.

## 2021-10-19 DIAGNOSIS — Z3401 Encounter for supervision of normal first pregnancy, first trimester: Secondary | ICD-10-CM | POA: Diagnosis not present

## 2021-10-19 DIAGNOSIS — Z113 Encounter for screening for infections with a predominantly sexual mode of transmission: Secondary | ICD-10-CM | POA: Diagnosis not present

## 2021-10-19 DIAGNOSIS — Z369 Encounter for antenatal screening, unspecified: Secondary | ICD-10-CM | POA: Diagnosis not present

## 2021-10-19 DIAGNOSIS — Z3A09 9 weeks gestation of pregnancy: Secondary | ICD-10-CM | POA: Diagnosis not present

## 2021-10-19 DIAGNOSIS — O26891 Other specified pregnancy related conditions, first trimester: Secondary | ICD-10-CM | POA: Diagnosis not present

## 2021-10-19 LAB — OB RESULTS CONSOLE GC/CHLAMYDIA
Chlamydia: NEGATIVE
Neisseria Gonorrhea: NEGATIVE

## 2021-10-19 LAB — OB RESULTS CONSOLE ABO/RH

## 2021-10-24 LAB — OB RESULTS CONSOLE RUBELLA ANTIBODY, IGM: Rubella: IMMUNE

## 2021-10-24 LAB — OB RESULTS CONSOLE HIV ANTIBODY (ROUTINE TESTING): HIV: NONREACTIVE

## 2021-10-24 LAB — OB RESULTS CONSOLE RPR: RPR: NONREACTIVE

## 2021-10-24 LAB — OB RESULTS CONSOLE ANTIBODY SCREEN: Antibody Screen: NEGATIVE

## 2021-10-24 LAB — OB RESULTS CONSOLE HEPATITIS B SURFACE ANTIGEN: Hepatitis B Surface Ag: NEGATIVE

## 2021-10-24 LAB — HEPATITIS C ANTIBODY: HCV Ab: NEGATIVE

## 2021-10-24 LAB — OB RESULTS CONSOLE ABO/RH: RH Type: POSITIVE

## 2021-11-21 DIAGNOSIS — Z3A14 14 weeks gestation of pregnancy: Secondary | ICD-10-CM | POA: Diagnosis not present

## 2021-11-21 DIAGNOSIS — Z3402 Encounter for supervision of normal first pregnancy, second trimester: Secondary | ICD-10-CM | POA: Diagnosis not present

## 2021-12-26 DIAGNOSIS — Z3A19 19 weeks gestation of pregnancy: Secondary | ICD-10-CM | POA: Diagnosis not present

## 2021-12-26 DIAGNOSIS — Z363 Encounter for antenatal screening for malformations: Secondary | ICD-10-CM | POA: Diagnosis not present

## 2022-01-23 DIAGNOSIS — M9905 Segmental and somatic dysfunction of pelvic region: Secondary | ICD-10-CM | POA: Diagnosis not present

## 2022-01-23 DIAGNOSIS — M9902 Segmental and somatic dysfunction of thoracic region: Secondary | ICD-10-CM | POA: Diagnosis not present

## 2022-01-23 DIAGNOSIS — M9903 Segmental and somatic dysfunction of lumbar region: Secondary | ICD-10-CM | POA: Diagnosis not present

## 2022-01-23 DIAGNOSIS — M9904 Segmental and somatic dysfunction of sacral region: Secondary | ICD-10-CM | POA: Diagnosis not present

## 2022-01-24 DIAGNOSIS — Z3A23 23 weeks gestation of pregnancy: Secondary | ICD-10-CM | POA: Diagnosis not present

## 2022-01-24 DIAGNOSIS — Z362 Encounter for other antenatal screening follow-up: Secondary | ICD-10-CM | POA: Diagnosis not present

## 2022-02-01 DIAGNOSIS — M9902 Segmental and somatic dysfunction of thoracic region: Secondary | ICD-10-CM | POA: Diagnosis not present

## 2022-02-01 DIAGNOSIS — M9904 Segmental and somatic dysfunction of sacral region: Secondary | ICD-10-CM | POA: Diagnosis not present

## 2022-02-01 DIAGNOSIS — M9905 Segmental and somatic dysfunction of pelvic region: Secondary | ICD-10-CM | POA: Diagnosis not present

## 2022-02-01 DIAGNOSIS — M9903 Segmental and somatic dysfunction of lumbar region: Secondary | ICD-10-CM | POA: Diagnosis not present

## 2022-02-08 DIAGNOSIS — M9902 Segmental and somatic dysfunction of thoracic region: Secondary | ICD-10-CM | POA: Diagnosis not present

## 2022-02-08 DIAGNOSIS — M9904 Segmental and somatic dysfunction of sacral region: Secondary | ICD-10-CM | POA: Diagnosis not present

## 2022-02-08 DIAGNOSIS — M9905 Segmental and somatic dysfunction of pelvic region: Secondary | ICD-10-CM | POA: Diagnosis not present

## 2022-02-08 DIAGNOSIS — M9903 Segmental and somatic dysfunction of lumbar region: Secondary | ICD-10-CM | POA: Diagnosis not present

## 2022-02-17 DIAGNOSIS — M9905 Segmental and somatic dysfunction of pelvic region: Secondary | ICD-10-CM | POA: Diagnosis not present

## 2022-02-17 DIAGNOSIS — M9902 Segmental and somatic dysfunction of thoracic region: Secondary | ICD-10-CM | POA: Diagnosis not present

## 2022-02-17 DIAGNOSIS — M9903 Segmental and somatic dysfunction of lumbar region: Secondary | ICD-10-CM | POA: Diagnosis not present

## 2022-02-17 DIAGNOSIS — M9904 Segmental and somatic dysfunction of sacral region: Secondary | ICD-10-CM | POA: Diagnosis not present

## 2022-02-20 DIAGNOSIS — Z3689 Encounter for other specified antenatal screening: Secondary | ICD-10-CM | POA: Diagnosis not present

## 2022-02-20 DIAGNOSIS — Z23 Encounter for immunization: Secondary | ICD-10-CM | POA: Diagnosis not present

## 2022-02-22 ENCOUNTER — Ambulatory Visit: Payer: BC Managed Care – PPO | Attending: Obstetrics and Gynecology

## 2022-02-22 ENCOUNTER — Other Ambulatory Visit: Payer: Self-pay

## 2022-02-22 DIAGNOSIS — R252 Cramp and spasm: Secondary | ICD-10-CM

## 2022-02-22 DIAGNOSIS — M5459 Other low back pain: Secondary | ICD-10-CM | POA: Insufficient documentation

## 2022-02-22 DIAGNOSIS — M6281 Muscle weakness (generalized): Secondary | ICD-10-CM | POA: Insufficient documentation

## 2022-02-22 DIAGNOSIS — O26892 Other specified pregnancy related conditions, second trimester: Secondary | ICD-10-CM | POA: Insufficient documentation

## 2022-02-22 DIAGNOSIS — Z3A27 27 weeks gestation of pregnancy: Secondary | ICD-10-CM | POA: Insufficient documentation

## 2022-02-22 DIAGNOSIS — R262 Difficulty in walking, not elsewhere classified: Secondary | ICD-10-CM | POA: Insufficient documentation

## 2022-02-22 NOTE — Therapy (Signed)
OUTPATIENT PHYSICAL THERAPY THORACOLUMBAR EVALUATION   Patient Name: Kelsey Barnett MRN: [id number] DOB:12-04-1990, 32 y.o., female Today's Date: 02/22/2022  END OF SESSION:  PT End of Session - 02/22/22 1237     Visit Number 1    Date for PT Re-Evaluation 04/19/22    Authorization Type BL[employer]    PT Start Time 1230    PT Stop Time 1308    PT Time Calculation (min) 38 min    Activity Tolerance Patient tolerated treatment well    Behavior During Therapy WFL for tasks assessed/performed             History reviewed. No pertinent past medical history. Past Surgical History:  Procedure Laterality Date   BREAST REDUCTION SURGERY Bilateral 11/05/2019   Procedure: BILATERAL BREAST REDUCTION WITH LIPOSUCTION;  Surgeon: Peggye Form, DO;  Location: [facility];  Service: Plastics;  Laterality: Bilateral;   WISDOM TOOTH EXTRACTION     Patient Active Problem List   Diagnosis Date Noted   S/P bilateral breast reduction 11/13/2019   Symptomatic mammary hypertrophy 06/18/2019   Back pain 06/18/2019   Neck pain 06/18/2019    PCP: [facility]  REFERRING PROVIDER: Arby Barrette, FNP  REFERRING DIAG: [phone] (ICD-10-CM) - Other specified pregnancy related conditions, unspecified trimester  Rationale for Evaluation and Treatment: Rehabilitation  THERAPY DIAG:  Other low back pain - Plan: PT plan of care cert/re-cert  Cramp and spasm - Plan: PT plan of care cert/re-cert  Muscle weakness (generalized) - Plan: PT plan of care cert/re-cert  ONSET DATE: 02/20/2022  SUBJECTIVE:                                                                                                                                                                                           SUBJECTIVE STATEMENT: Patient reports she did a HIT class a while back and suffered a back injury which she thought was her hip initially.  She has been having pain since and is now [redacted]  weeks gestation and having increased pain.  She states her pain ranges from 4/10 to 7/10.  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  She hopes to gain control of her pain as her pregnancy progresses.    PERTINENT HISTORY:  [redacted] weeks gestation  PAIN:  Are you having pain? Yes: NPRS scale: 4/10 Pain location: right low back and hip Pain description: aching Aggravating factors: start up, walking Relieving factors: ice, heat Patient states the pain can get up to 7/10  PRECAUTIONS: Other: pregnancy  WEIGHT BEARING RESTRICTIONS: No  FALLS:  Has patient fallen in last 6 months? No  LIVING ENVIRONMENT: Lives with: lives with their spouse Lives in: House/apartment   OCCUPATION: office work/marketing  PLOF: Independent, Independent with basic ADLs, Independent with household mobility without device, Independent with community mobility without device, Independent with homemaking with ambulation, Independent with gait, and Independent with transfers  PATIENT GOALS: Lessen the pain   NEXT MD VISIT: prn  OBJECTIVE:   DIAGNOSTIC FINDINGS:  na  PATIENT SURVEYS:  FOTO 49, predicted 1  SCREENING FOR RED FLAGS: Bowel or bladder incontinence: No Spinal tumors: No Cauda equina syndrome: No Compression fracture: No Abdominal aneurysm: No  COGNITION: Overall cognitive status: Within functional limits for tasks assessed     SENSATION: WFL  MUSCLE LENGTH: Hamstrings: Right WNL ; Left WNL  Thomas test: Right neg ; Left neg   POSTURE: No Significant postural limitations   LUMBAR ROM:   AROM eval  Flexion Pain begins at fingertips just above knee but she is able to go to toes  Extension WNL  Right lateral flexion Fingertips just above joint line  Left lateral flexion Fingertips to joint line  Right rotation WNL  Left rotation WNL   (Blank rows = not tested)  LOWER EXTREMITY ROM:      WNL  LOWER EXTREMITY MMT:    5/5 throughout  LUMBAR SPECIAL TESTS:  Straight leg raise test: Negative   GAIT: Distance walked: 30 Assistive device utilized: None Level of assistance: Complete Independence Comments: antalgic  TODAY'S TREATMENT:                                                                                                                              DATE: 02/22/22 Initial eval completed and initiated HEP    PATIENT EDUCATION:  Education details: Initiated HEP Person educated: Patient Education method: Programmer, multimedia, Facilities manager, Verbal cues, and Handouts Education comprehension: verbalized understanding, returned demonstration, and verbal cues required  HOME EXERCISE PROGRAM: Access Code: [email or web address] URL: [email or web address] Date: 02/22/2022 Prepared by: Mikey Kirschner  Exercises - Supine Figure 4 Piriformis Stretch  - 1 x daily - 7 x weekly - 1 sets - 3 reps - 30 hold - Seated Piriformis Stretch with Trunk Bend  - 1 x daily - 7 x weekly - 1 sets - 3 reps - 30 hold - Supine ITB Stretch with Strap  - 1 x daily - 7 x weekly - 1 sets - 3 reps - 30 hold  ASSESSMENT:  CLINICAL IMPRESSION: Patient is a 32 y.o. female, who is [redacted] weeks gestation, who was seen today for physical therapy evaluation and treatment for right low back pain.   She presents with fairly normal ROM but pain with fwd flexion and side bending bilaterally.  She has some tightness in right hip vs. Left.  She is neurologically intact.  She would respond well to skilled PT for LE flexibility exercises, core strengthening and symptom control leading up  to her delivery.    OBJECTIVE IMPAIRMENTS: difficulty walking, decreased ROM, decreased strength, increased muscle spasms, impaired flexibility, and pain.   ACTIVITY LIMITATIONS: lifting, bending, squatting, sleeping, transfers, continence, and dressing  PARTICIPATION LIMITATIONS: meal prep, cleaning, laundry, shopping, community  activity, and occupation  PERSONAL FACTORS: Fitness, Profession, and 1 comorbidity: pregnancy  are also affecting patient's functional outcome.   REHAB POTENTIAL: Excellent  CLINICAL DECISION MAKING: Stable/uncomplicated  EVALUATION COMPLEXITY: Low   GOALS: Goals reviewed with patient? Yes  SHORT TERM GOALS: Target date: 03/22/2022    Patient will be independent with initial HEP  Baseline: Goal status: INITIAL  2.  Pain report to be no greater than 4/10  Baseline:  Goal status: INITIAL   LONG TERM GOALS: Target date: 04/19/2022    Patient to be independent with advanced HEP  Baseline:  Goal status: INITIAL  2.  Patient to report pain no greater than 2/10  Baseline:  Goal status: INITIAL  3.  FOTO score to be 66 Baseline:  Goal status: INITIAL  4.  Patient to report 85% improvement in symptoms Baseline:  Goal status: INITIAL   PLAN:  PT FREQUENCY: 1-2x/week  PT DURATION: 8 weeks  PLANNED INTERVENTIONS: Therapeutic exercises, Therapeutic activity, Neuromuscular re-education, Balance training, Gait training, Patient/Family education, Self Care, Joint mobilization, Aquatic Therapy, Dry Needling, Electrical stimulation, Spinal mobilization, Cryotherapy, Moist heat, Taping, Manual therapy, and Re-evaluation.  PLAN FOR NEXT SESSION: Nustep, begin right hip stability training and core stabilization, along with hip mobility and pelvic floor training.     Victorino Dike B. Adante Courington, PT 02/22/22 11:40 PM

## 2022-02-27 ENCOUNTER — Ambulatory Visit: Payer: BC Managed Care – PPO | Admitting: Physical Therapy

## 2022-02-27 DIAGNOSIS — M6281 Muscle weakness (generalized): Secondary | ICD-10-CM

## 2022-02-27 DIAGNOSIS — O26892 Other specified pregnancy related conditions, second trimester: Secondary | ICD-10-CM | POA: Diagnosis not present

## 2022-02-27 DIAGNOSIS — R262 Difficulty in walking, not elsewhere classified: Secondary | ICD-10-CM | POA: Diagnosis not present

## 2022-02-27 DIAGNOSIS — R252 Cramp and spasm: Secondary | ICD-10-CM

## 2022-02-27 DIAGNOSIS — M5459 Other low back pain: Secondary | ICD-10-CM

## 2022-02-27 DIAGNOSIS — Z3A27 27 weeks gestation of pregnancy: Secondary | ICD-10-CM | POA: Diagnosis not present

## 2022-02-27 NOTE — Therapy (Signed)
OUTPATIENT PHYSICAL THERAPY TREATMENT NOTE   Patient Name: Kelsey Barnett MRN: [id number] DOB:06-24-1990, 32 y.o., female Today's Date: 02/27/2022  PCP: [facility]  REFERRING PROVIDER: Arby Barrette, FNP   END OF SESSION:   PT End of Session - 02/27/22 1444     Visit Number 2    Date for PT Re-Evaluation 04/19/22    Authorization Type [employer]    PT Start Time 1400    PT Stop Time 1443    PT Time Calculation (min) 43 min    Activity Tolerance Patient tolerated treatment well    Behavior During Therapy WFL for tasks assessed/performed             No past medical history on file. Past Surgical History:  Procedure Laterality Date   BREAST REDUCTION SURGERY Bilateral 11/05/2019   Procedure: BILATERAL BREAST REDUCTION WITH LIPOSUCTION;  Surgeon: Peggye Form, DO;  Location: [facility];  Service: Plastics;  Laterality: Bilateral;   WISDOM TOOTH EXTRACTION     Patient Active Problem List   Diagnosis Date Noted   S/P bilateral breast reduction 11/13/2019   Symptomatic mammary hypertrophy 06/18/2019   Back pain 06/18/2019   Neck pain 06/18/2019    REFERRING DIAG: O26.899 (ICD-10-CM) - Other specified pregnancy related conditions, unspecified trimester   THERAPY DIAG:  Other low back pain  Cramp and spasm  Muscle weakness (generalized)  Rationale for Evaluation and Treatment Rehabilitation  PERTINENT HISTORY: [redacted] weeks gestation   PRECAUTIONS: Other: pregnancy   SUBJECTIVE:                                                                                                                                                                                      SUBJECTIVE STATEMENT:  Patient reports she did a HIT class a while back and suffered a back injury which she thought was her hip initially.  She has been having pain since and is now [redacted] weeks gestation and having increased pain.  She states her pain ranges from 4/10 to 7/10.  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  She hopes to gain control of her pain as her pregnancy progresses.    PAIN:  Are you having pain? Yes: NPRS scale: 4/10 Pain location: right low back and hip Pain description: aching Aggravating factors: start up, walking Relieving factors: ice, heat Patient states the pain can get up to 7/10  OBJECTIVE:    DIAGNOSTIC FINDINGS:  na   PATIENT SURVEYS:  FOTO 49, predicted 62   SCREENING FOR RED FLAGS: Bowel or  bladder incontinence: No Spinal tumors: No Cauda equina syndrome: No Compression fracture: No Abdominal aneurysm: No   COGNITION: Overall cognitive status: Within functional limits for tasks assessed                          SENSATION: WFL   MUSCLE LENGTH: Hamstrings: Right WNL ; Left WNL  Thomas test: Right neg ; Left neg    POSTURE: No Significant postural limitations     LUMBAR ROM:    AROM eval  Flexion Pain begins at fingertips just above knee but she is able to go to toes  Extension WNL  Right lateral flexion Fingertips just above joint line  Left lateral flexion Fingertips to joint line  Right rotation WNL  Left rotation WNL   (Blank rows = not tested)   LOWER EXTREMITY ROM:      WNL   LOWER EXTREMITY MMT:     5/5 throughout   LUMBAR SPECIAL TESTS:  Straight leg raise test: Negative     GAIT: Distance walked: 30 Assistive device utilized: None Level of assistance: Complete Independence Comments: antalgic   TODAY'S TREATMENT:  Date 02/27/2022:  Bike for 5 min, lvl 3 with PT present to get subjective.  Supine figure 4 stretch 2x 30 sec, bilat Bridge 2x10 with BTB Siedlying clams with BTB 2x30  LTR x20  PPT x20  TrA activation, 5 sec hold x10                                                                                                                              DATE: 02/22/22 Initial eval completed and  initiated HEP      PATIENT EDUCATION:  Education details: Initiated HEP Person educated: Patient Education method: Programmer, multimedia, Facilities manager, Verbal cues, and Handouts Education comprehension: verbalized understanding, returned demonstration, and verbal cues required   HOME EXERCISE PROGRAM: Access Code: [email or web address] URL: [email or web address] Date: 02/22/2022 Prepared by: Mikey Kirschner   Exercises - Supine Figure 4 Piriformis Stretch  - 1 x daily - 7 x weekly - 1 sets - 3 reps - 30 hold - Seated Piriformis Stretch with Trunk Bend  - 1 x daily - 7 x weekly - 1 sets - 3 reps - 30 hold - Supine ITB Stretch with Strap  - 1 x daily - 7 x weekly - 1 sets - 3 reps - 30 hold   ASSESSMENT:   CLINICAL IMPRESSION: Pt presents to her first follow up appt with compliance with her HEP. Session with focus on lower trunk mobility, proximal hip strength and core activation with good response. Updated HEP with new exercises prescribed today. Pt will continue to benefit from skilled PT to address continued deficits.     OBJECTIVE IMPAIRMENTS: difficulty walking, decreased ROM, decreased strength, increased muscle spasms, impaired flexibility, and pain.    ACTIVITY LIMITATIONS: lifting, bending, squatting, sleeping, transfers, continence, and dressing   PARTICIPATION LIMITATIONS: meal prep, cleaning,  laundry, shopping, community activity, and occupation   PERSONAL FACTORS: Fitness, Profession, and 1 comorbidity: pregnancy  are also affecting patient's functional outcome.    REHAB POTENTIAL: Excellent   CLINICAL DECISION MAKING: Stable/uncomplicated   EVALUATION COMPLEXITY: Low     GOALS: Goals reviewed with patient? Yes   SHORT TERM GOALS: Target date: 03/22/2022     Patient will be independent with initial HEP  Baseline: Goal status: INITIAL   2.  Pain report to be no greater than 4/10  Baseline:  Goal status: INITIAL     LONG TERM GOALS: Target date: 04/19/2022      Patient to be independent with advanced HEP  Baseline:  Goal status: INITIAL   2.  Patient to report pain no greater than 2/10  Baseline:  Goal status: INITIAL   3.  FOTO score to be 66 Baseline:  Goal status: INITIAL   4.  Patient to report 85% improvement in symptoms Baseline:  Goal status: INITIAL     PLAN:   PT FREQUENCY: 1-2x/week   PT DURATION: 8 weeks   PLANNED INTERVENTIONS: Therapeutic exercises, Therapeutic activity, Neuromuscular re-education, Balance training, Gait training, Patient/Family education, Self Care, Joint mobilization, Aquatic Therapy, Dry Needling, Electrical stimulation, Spinal mobilization, Cryotherapy, Moist heat, Taping, Manual therapy, and Re-evaluation.   PLAN FOR NEXT SESSION: Nustep, begin right hip stability training and core stabilization, along with hip mobility and pelvic floor training.    Champ Mungo, PT 02/27/2022, 2:45 PM

## 2022-03-01 ENCOUNTER — Ambulatory Visit: Payer: BC Managed Care – PPO

## 2022-03-01 DIAGNOSIS — O26892 Other specified pregnancy related conditions, second trimester: Secondary | ICD-10-CM | POA: Diagnosis not present

## 2022-03-01 DIAGNOSIS — R262 Difficulty in walking, not elsewhere classified: Secondary | ICD-10-CM | POA: Diagnosis not present

## 2022-03-01 DIAGNOSIS — R252 Cramp and spasm: Secondary | ICD-10-CM

## 2022-03-01 DIAGNOSIS — M5459 Other low back pain: Secondary | ICD-10-CM | POA: Diagnosis not present

## 2022-03-01 DIAGNOSIS — M6281 Muscle weakness (generalized): Secondary | ICD-10-CM

## 2022-03-01 DIAGNOSIS — Z3A27 27 weeks gestation of pregnancy: Secondary | ICD-10-CM | POA: Diagnosis not present

## 2022-03-01 NOTE — Therapy (Signed)
OUTPATIENT PHYSICAL THERAPY TREATMENT NOTE   Patient Name: Kelsey Barnett MRN: [id number] DOB:[birth year], 32 y.o., female Today's Date: 03/01/2022  PCP: [facility]  REFERRING PROVIDER: Arby Barrette, FNP   END OF SESSION:   PT End of Session - 03/01/22 0809     Visit Number 3    Date for PT Re-Evaluation 04/19/22    Authorization Type BLUE CROSS BLUE [employer]IELD    PT Start Time 0802    PT Stop Time 0843    PT Time Calculation (min) 41 min    Activity Tolerance Patient tolerated treatment well    Behavior During Therapy [facility] for tasks assessed/performed             History reviewed. No pertinent past medical history. Past Surgical History:  Procedure Laterality Date   BREAST REDUCTION SURGERY Bilateral 11/05/2019   Procedure: BILATERAL BREAST REDUCTION WITH LIPOSUCTION;  Surgeon: Kelsey Form, DO;  Location: [facility];  Service: Plastics;  Laterality: Bilateral;   WISDOM TOOTH EXTRACTION     Patient Active Problem List   Diagnosis Date Noted   S/P bilateral breast reduction 11/13/2019   Symptomatic mammary hypertrophy 06/18/2019   Back pain 06/18/2019   Neck pain 06/18/2019    REFERRING DIAG: O26.899 (ICD-10-CM) - Other specified pregnancy related conditions, unspecified trimester   THERAPY DIAG:  Other low back pain  Cramp and spasm  Muscle weakness (generalized)  Difficulty in walking, not elsewhere classified  Rationale for Evaluation and Treatment Rehabilitation  PERTINENT HISTORY: [redacted] weeks gestation   PRECAUTIONS: Other: pregnancy   SUBJECTIVE:                                                                                                                                                                               SUBJECTIVE STATEMENT:  Patient reports she is doing better.  "I wore some heels yesterday and I had a little increase in pain but overall better"    PAIN:  Are you having pain? Yes: NPRS scale: 4/10 Pain  location: right low back and hip Pain description: aching Aggravating factors: start up, walking Relieving factors: ice, heat Patient states the pain can get up to 7/10  OBJECTIVE:    DIAGNOSTIC FINDINGS:  na   PATIENT SURVEYS:  FOTO 49, predicted 1   SCREENING FOR RED FLAGS: Bowel or bladder incontinence: No Spinal tumors: No Cauda equina syndrome: No Compression fracture: No Abdominal aneurysm: No   COGNITION: Overall cognitive status: Within functional limits for tasks assessed                          SENSATION: WFL   MUSCLE LENGTH: Hamstrings: Right  WNL ; Left WNL  Thomas test: Right neg ; Left neg    POSTURE: No Significant postural limitations     LUMBAR ROM:    AROM eval  Flexion Pain begins at fingertips just above knee but she is able to go to toes  Extension WNL  Right lateral flexion Fingertips just above joint line  Left lateral flexion Fingertips to joint line  Right rotation WNL  Left rotation WNL   (Blank rows = not tested)   LOWER EXTREMITY ROM:      WNL   LOWER EXTREMITY MMT:     5/5 throughout   LUMBAR SPECIAL TESTS:  Straight leg raise test: Negative     GAIT: Distance walked: 30 Assistive device utilized: None Level of assistance: Complete Independence Comments: antalgic   TODAY'S TREATMENT:  Date 03/01/2022:  Nustep x 5 min (PT present to discuss progress and goals)  Standing hamstring stretch x 3 each LE holding 30 sec Standing quad/hip flexor stretch 3 x 30 sec each LE Standing adductor stretch 2 x 30 sec each side Seated piriformis stretch 2 x 30 sec each side Quadruped child's pose stretch x 5 with increasing hip abduction each rep holding 5-10 sec each Hooklying trunk rotation x 20 Bridge 2x10  Supine clamshell x 20 with yellow loop Sidelying clams with yellow loop x 20 both Sidelying hip IR with LE PPT x20   Date 02/27/2022:  Bike for 5 min, lvl 3 with PT present to get subjective.  Supine figure 4 stretch 2x  30 sec, bilat Bridge 2x10 with BTB Siedlying clams with BTB 2x30  LTR x20  PPT x20  TrA activation, 5 sec hold x10                                                                                                                              DATE: 02/22/22 Initial eval completed and initiated HEP      PATIENT EDUCATION:  Education details: Initiated HEP Person educated: Patient Education method: Programmer, multimedia, Facilities manager, Verbal cues, and Handouts Education comprehension: verbalized understanding, returned demonstration, and verbal cues required   HOME EXERCISE PROGRAM: Access Code: [email or web address] URL: [email or web address] Date: 03/01/2022 Prepared by: Kelsey Barnett  Exercises - Seated Piriformis Stretch with Trunk Bend  - 1 x daily - 7 x weekly - 1 sets - 3 reps - 30 hold - Supine Figure 4 Piriformis Stretch  - 1 x daily - 7 x weekly - 1 sets - 3 reps - 30 hold - Supine ITB Stretch with Strap  - 1 x daily - 7 x weekly - 1 sets - 3 reps - 30 hold - Supine Bridge  - 1 x daily - 7 x weekly - 2 sets - 10 reps - Clamshell  - 1 x daily - 7 x weekly - 2 sets - 10 reps - Supine Lower Trunk Rotation  - 1 x daily - 7 x weekly - 2 sets - 10 reps -  Supine Posterior Pelvic Tilt  - 1 x daily - 7 x weekly - 2 sets - 10 reps - Standing Hamstring Stretch on Chair  - 1 x daily - 7 x weekly - 1 sets - 3 reps - 30 hold - Standing Quad Stretch with Table and Chair Support  - 1 x daily - 7 x weekly - 1 sets - 3 reps - 30 hold - Side Lunge Adductor Stretch  - 1 x daily - 7 x weekly - 1 sets - 2 reps - 20-30 sec hold  ASSESSMENT:   CLINICAL IMPRESSION: Kelsey Barnett is progressing appropriately.  She was able to tolerate increased resistance on clamshell and addition of new stretches and hip strengthening exercises.  She appears to be compliant with her HEP as she needs no verbal cues or correction on technique.   Updated HEP with new exercises prescribed today. Pt will continue to benefit from  skilled PT to address continued deficits.     OBJECTIVE IMPAIRMENTS: difficulty walking, decreased ROM, decreased strength, increased muscle spasms, impaired flexibility, and pain.    ACTIVITY LIMITATIONS: lifting, bending, squatting, sleeping, transfers, continence, and dressing   PARTICIPATION LIMITATIONS: meal prep, cleaning, laundry, shopping, community activity, and occupation   PERSONAL FACTORS: Fitness, Profession, and 1 comorbidity: pregnancy  are also affecting patient's functional outcome.    REHAB POTENTIAL: Excellent   CLINICAL DECISION MAKING: Stable/uncomplicated   EVALUATION COMPLEXITY: Low     GOALS: Goals reviewed with patient? Yes   SHORT TERM GOALS: Target date: 03/22/2022     Patient will be independent with initial HEP  Baseline: Goal status: INITIAL   2.  Pain report to be no greater than 4/10  Baseline:  Goal status: INITIAL     LONG TERM GOALS: Target date: 04/19/2022     Patient to be independent with advanced HEP  Baseline:  Goal status: INITIAL   2.  Patient to report pain no greater than 2/10  Baseline:  Goal status: INITIAL   3.  FOTO score to be 66 Baseline:  Goal status: INITIAL   4.  Patient to report 85% improvement in symptoms Baseline:  Goal status: INITIAL     PLAN:   PT FREQUENCY: 1-2x/week   PT DURATION: 8 weeks   PLANNED INTERVENTIONS: Therapeutic exercises, Therapeutic activity, Neuromuscular re-education, Balance training, Gait training, Patient/Family education, Self Care, Joint mobilization, Aquatic Therapy, Dry Needling, Electrical stimulation, Spinal mobilization, Cryotherapy, Moist heat, Taping, Manual therapy, and Re-evaluation.   PLAN FOR NEXT SESSION: Nustep, progress right hip stability training and core stabilization, along with hip mobility and pelvic floor training.    Victorino Dike B. Dre Gamino, PT 03/01/22 8:44 AM  [facility] [address] [place], [place]  [address] Phone # [phone] Fax [phone]

## 2022-03-03 ENCOUNTER — Encounter: Payer: BC Managed Care – PPO | Admitting: Physical Therapy

## 2022-03-07 ENCOUNTER — Ambulatory Visit: Payer: BC Managed Care – PPO

## 2022-03-07 DIAGNOSIS — M6281 Muscle weakness (generalized): Secondary | ICD-10-CM | POA: Diagnosis not present

## 2022-03-07 DIAGNOSIS — R262 Difficulty in walking, not elsewhere classified: Secondary | ICD-10-CM

## 2022-03-07 DIAGNOSIS — Z3A27 27 weeks gestation of pregnancy: Secondary | ICD-10-CM | POA: Diagnosis not present

## 2022-03-07 DIAGNOSIS — R252 Cramp and spasm: Secondary | ICD-10-CM

## 2022-03-07 DIAGNOSIS — M5459 Other low back pain: Secondary | ICD-10-CM

## 2022-03-07 DIAGNOSIS — O26892 Other specified pregnancy related conditions, second trimester: Secondary | ICD-10-CM | POA: Diagnosis not present

## 2022-03-07 NOTE — Therapy (Signed)
OUTPATIENT PHYSICAL THERAPY TREATMENT NOTE   Patient Name: Kelsey Barnett MRN: [id number] DOB:November 27, 1990, 32 y.o., female Today's Date: 03/07/2022  PCP: [facility]  REFERRING PROVIDER: Arby Barrette, FNP   END OF SESSION:   PT End of Session - 03/07/22 0802     Visit Number 4    Date for PT Re-Evaluation 04/19/22    Authorization Type BLUE CROSS BLUE [employer]IELD    PT Start Time 0803    PT Stop Time 0846    PT Time Calculation (min) 43 min    Activity Tolerance Patient tolerated treatment well    Behavior During Therapy [facility] for tasks assessed/performed             History reviewed. No pertinent past medical history. Past Surgical History:  Procedure Laterality Date   BREAST REDUCTION SURGERY Bilateral 11/05/2019   Procedure: BILATERAL BREAST REDUCTION WITH LIPOSUCTION;  Surgeon: Peggye Form, DO;  Location: [facility];  Service: Plastics;  Laterality: Bilateral;   WISDOM TOOTH EXTRACTION     Patient Active Problem List   Diagnosis Date Noted   S/P bilateral breast reduction 11/13/2019   Symptomatic mammary hypertrophy 06/18/2019   Back pain 06/18/2019   Neck pain 06/18/2019    REFERRING DIAG: O26.899 (ICD-10-CM) - Other specified pregnancy related conditions, unspecified trimester   THERAPY DIAG:  Other low back pain  Muscle weakness (generalized)  Cramp and spasm  Difficulty in walking, not elsewhere classified  Rationale for Evaluation and Treatment Rehabilitation  PERTINENT HISTORY: [redacted] weeks gestation   PRECAUTIONS: Other: pregnancy   SUBJECTIVE:                                                                                                                                                                               SUBJECTIVE STATEMENT:  Patient reports she is doing well.  She states she is about 50% better.     PAIN:  Are you having pain? Yes: NPRS scale: 4/10 Pain location: right low back and hip Pain description:  aching Aggravating factors: start up, walking Relieving factors: ice, heat Patient states the pain can get up to 7/10  OBJECTIVE:    DIAGNOSTIC FINDINGS:  na   PATIENT SURVEYS:  FOTO 49, predicted 42   SCREENING FOR RED FLAGS: Bowel or bladder incontinence: No Spinal tumors: No Cauda equina syndrome: No Compression fracture: No Abdominal aneurysm: No   COGNITION: Overall cognitive status: Within functional limits for tasks assessed                          SENSATION: WFL   MUSCLE LENGTH: Hamstrings: Right WNL ; Left WNL  [name]: Right  neg ; Left neg    POSTURE: No Significant postural limitations     LUMBAR ROM:    AROM eval  Flexion Pain begins at fingertips just above knee but she is able to go to toes  Extension WNL  Right lateral flexion Fingertips just above joint line  Left lateral flexion Fingertips to joint line  Right rotation WNL  Left rotation WNL   (Blank rows = not tested)   LOWER EXTREMITY ROM:      WNL   LOWER EXTREMITY MMT:     5/5 throughout   LUMBAR SPECIAL TESTS:  Straight leg raise test: Negative     GAIT: Distance walked: 30 Assistive device utilized: None Level of assistance: Complete Independence Comments: antalgic   TODAY'S TREATMENT:  Date 03/07/2022:  Nustep x 5 min (PT present to discuss progress and goals)  Standing hamstring stretch x 3 each LE holding 30 sec Standing quad/hip flexor stretch 3 x 30 sec each LE Standing adductor stretch 2 x 30 sec each side Supine clamshell x 20 with yellow loop Sidelying clams with yellow loop x 20 both Sidelying hip IR with ball between knees x 20 each side Quadruped child's pose stretch x 5 with increasing hip abduction each rep holding 5-10 sec each Seated piriformis stretch 3 x 30 sec each side Icepack to low back in hook lying x 8 min at end of treatment  Date 03/01/2022:  Nustep x 5 min (PT present to discuss progress and goals)  Standing hamstring stretch x 3 each LE  holding 30 sec Standing quad/hip flexor stretch 3 x 30 sec each LE Standing adductor stretch 2 x 30 sec each side Seated piriformis stretch 2 x 30 sec each side Quadruped child's pose stretch x 5 with increasing hip abduction each rep holding 5-10 sec each Hooklying trunk rotation x 20 Bridge 2x10  Supine clamshell x 20 with yellow loop Sidelying clams with yellow loop x 20 both Sidelying hip IR with LE PPT x20   Date 02/27/2022:  Bike for 5 min, lvl 3 with PT present to get subjective.  Supine figure 4 stretch 2x 30 sec, bilat Bridge 2x10 with BTB Siedlying clams with BTB 2x30  LTR x20  PPT x20  TrA activation, 5 sec hold x10                                                                                                                              DATE: 02/22/22 Initial eval completed and initiated HEP      PATIENT EDUCATION:  Education details: Initiated HEP Person educated: Patient Education method: Programmer, multimedia, Facilities manager, Verbal cues, and Handouts Education comprehension: verbalized understanding, returned demonstration, and verbal cues required   HOME EXERCISE PROGRAM: Access Code: [email or web address] URL: [email or web address] Date: 03/01/2022 Prepared by: Mikey Kirschner  Exercises - Seated Piriformis Stretch with Trunk Bend  - 1 x daily - 7 x weekly - 1 sets - 3 reps - 30 hold -  Supine Figure 4 Piriformis Stretch  - 1 x daily - 7 x weekly - 1 sets - 3 reps - 30 hold - Supine ITB Stretch with Strap  - 1 x daily - 7 x weekly - 1 sets - 3 reps - 30 hold - Supine Bridge  - 1 x daily - 7 x weekly - 2 sets - 10 reps - Clamshell  - 1 x daily - 7 x weekly - 2 sets - 10 reps - Supine Lower Trunk Rotation  - 1 x daily - 7 x weekly - 2 sets - 10 reps - Supine Posterior Pelvic Tilt  - 1 x daily - 7 x weekly - 2 sets - 10 reps - Standing Hamstring Stretch on Chair  - 1 x daily - 7 x weekly - 1 sets - 3 reps - 30 hold - Standing Quad Stretch with Table and Chair Support   - 1 x daily - 7 x weekly - 1 sets - 3 reps - 30 hold - Side Lunge Adductor Stretch  - 1 x daily - 7 x weekly - 1 sets - 2 reps - 20-30 sec hold  ASSESSMENT:   CLINICAL IMPRESSION: Kelsey Barnett is meeting all short term goals and progressing toward long term goals with ease.   Pt will continue to benefit from skilled PT to address continued deficits.     OBJECTIVE IMPAIRMENTS: difficulty walking, decreased ROM, decreased strength, increased muscle spasms, impaired flexibility, and pain.    ACTIVITY LIMITATIONS: lifting, bending, squatting, sleeping, transfers, continence, and dressing   PARTICIPATION LIMITATIONS: meal prep, cleaning, laundry, shopping, community activity, and occupation   PERSONAL FACTORS: Fitness, Profession, and 1 comorbidity: pregnancy  are also affecting patient's functional outcome.    REHAB POTENTIAL: Excellent   CLINICAL DECISION MAKING: Stable/uncomplicated   EVALUATION COMPLEXITY: Low     GOALS: Goals reviewed with patient? Yes   SHORT TERM GOALS: Target date: 03/22/2022     Patient will be independent with initial HEP  Baseline: Goal status: MET   2.  Pain report to be no greater than 4/10  Baseline:  Goal status: MET     LONG TERM GOALS: Target date: 04/19/2022     Patient to be independent with advanced HEP  Baseline:  Goal status: INITIAL   2.  Patient to report pain no greater than 2/10  Baseline:  Goal status: INITIAL   3.  FOTO score to be 66 Baseline:  Goal status: INITIAL   4.  Patient to report 85% improvement in symptoms Baseline:  Goal status: INITIAL     PLAN:   PT FREQUENCY: 1-2x/week   PT DURATION: 8 weeks   PLANNED INTERVENTIONS: Therapeutic exercises, Therapeutic activity, Neuromuscular re-education, Balance training, Gait training, Patient/Family education, Self Care, Joint mobilization, Aquatic Therapy, Dry Needling, Electrical stimulation, Spinal mobilization, Cryotherapy, Moist heat, Taping, Manual therapy, and  Re-evaluation.   PLAN FOR NEXT SESSION: Nustep, progress right hip stability training and core stabilization, along with hip mobility and pelvic floor training.    Victorino Dike B. Shanaiya Bene, PT 03/07/22 8:42 AM   [facility] [address], [address] [place], [place] [address] Phone # [phone] Fax [phone]

## 2022-03-09 ENCOUNTER — Ambulatory Visit: Payer: BC Managed Care – PPO

## 2022-03-09 DIAGNOSIS — R262 Difficulty in walking, not elsewhere classified: Secondary | ICD-10-CM

## 2022-03-09 DIAGNOSIS — M5459 Other low back pain: Secondary | ICD-10-CM

## 2022-03-09 DIAGNOSIS — R252 Cramp and spasm: Secondary | ICD-10-CM

## 2022-03-09 DIAGNOSIS — O26892 Other specified pregnancy related conditions, second trimester: Secondary | ICD-10-CM | POA: Diagnosis not present

## 2022-03-09 DIAGNOSIS — M6281 Muscle weakness (generalized): Secondary | ICD-10-CM

## 2022-03-09 DIAGNOSIS — Z3A27 27 weeks gestation of pregnancy: Secondary | ICD-10-CM | POA: Diagnosis not present

## 2022-03-09 NOTE — Therapy (Signed)
OUTPATIENT PHYSICAL THERAPY TREATMENT NOTE   Patient Name: Kelsey Barnett MRN: [id number] DOB:28-Jul-1990, 32 y.o., female Today's Date: 03/09/2022  PCP: [facility]  REFERRING PROVIDER: Arby Barrette, FNP   END OF SESSION:   PT End of Session - 03/09/22 0813     Visit Number 5    Date for PT Re-Evaluation 04/19/22    Authorization Type BLUE CROSS BLUE [employer]IELD    PT Start Time 0803    PT Stop Time 0845    PT Time Calculation (min) 42 min    Activity Tolerance Patient tolerated treatment well    Behavior During Therapy [facility] for tasks assessed/performed             History reviewed. No pertinent past medical history. Past Surgical History:  Procedure Laterality Date   BREAST REDUCTION SURGERY Bilateral 11/05/2019   Procedure: BILATERAL BREAST REDUCTION WITH LIPOSUCTION;  Surgeon: Peggye Form, DO;  Location: [facility];  Service: Plastics;  Laterality: Bilateral;   WISDOM TOOTH EXTRACTION     Patient Active Problem List   Diagnosis Date Noted   S/P bilateral breast reduction 11/13/2019   Symptomatic mammary hypertrophy 06/18/2019   Back pain 06/18/2019   Neck pain 06/18/2019    REFERRING DIAG: O26.899 (ICD-10-CM) - Other specified pregnancy related conditions, unspecified trimester   THERAPY DIAG:  Other low back pain  Muscle weakness (generalized)  Cramp and spasm  Difficulty in walking, not elsewhere classified  Rationale for Evaluation and Treatment Rehabilitation  PERTINENT HISTORY: [redacted] weeks gestation   PRECAUTIONS: Other: pregnancy   SUBJECTIVE:                                                                                                                                                                               SUBJECTIVE STATEMENT:  Patient reports pain at 3.5/10.  She reports she did a cycling class last night and she experienced soreness but was able to complete the class.       PAIN:  Are you having pain? Yes: NPRS  scale: 4/10 Pain location: right low back and hip Pain description: aching Aggravating factors: start up, walking Relieving factors: ice, heat Patient states the pain can get up to 7/10  OBJECTIVE:    DIAGNOSTIC FINDINGS:  na   PATIENT SURVEYS:  FOTO 49, predicted 37   SCREENING FOR RED FLAGS: Bowel or bladder incontinence: No Spinal tumors: No Cauda equina syndrome: No Compression fracture: No Abdominal aneurysm: No   COGNITION: Overall cognitive status: Within functional limits for tasks assessed                          SENSATION: [facility]  MUSCLE LENGTH: Hamstrings: Right WNL ; Left WNL  Thomas test: Right neg ; Left neg    POSTURE: No Significant postural limitations     LUMBAR ROM:    AROM eval  Flexion Pain begins at fingertips just above knee but she is able to go to toes  Extension WNL  Right lateral flexion Fingertips just above joint line  Left lateral flexion Fingertips to joint line  Right rotation WNL  Left rotation WNL   (Blank rows = not tested)   LOWER EXTREMITY ROM:      WNL   LOWER EXTREMITY MMT:     5/5 throughout   LUMBAR SPECIAL TESTS:  Straight leg raise test: Negative     GAIT: Distance walked: 30 Assistive device utilized: None Level of assistance: Complete Independence Comments: antalgic   TODAY'S TREATMENT:  Date 03/09/2022:  Nustep x 5 min (PT present to discuss progress and goals)  Supine Piriformis stretch 3 x 20 sec each side Supine IT band/hip stretch 3 x 20 sec each side Quadruped fire hydrant x 20 each side Quadruped child's pose stretch x 5 with increasing hip abduction each rep holding 5-10 sec each Quadruped bird dog x 20 each side Lateral band walks with yellow band 3 laps of 10 steps  Supine clamshell x 20 with yellow loop Sidelying hip IR with blue foam roller between knees x 20 each side Ice pack to low back in hook lying x 8 min at end of treatment  Date 03/07/2022:  Nustep x 5 min (PT present to  discuss progress and goals)  Standing hamstring stretch x 3 each LE holding 30 sec Standing quad/hip flexor stretch 3 x 30 sec each LE Standing adductor stretch 2 x 30 sec each side Supine clamshell x 20 with yellow loop Sidelying clams with yellow loop x 20 both Sidelying hip IR with ball between knees x 20 each side Quadruped child's pose stretch x 5 with increasing hip abduction each rep holding 5-10 sec each Seated piriformis stretch 3 x 30 sec each side Icepack to low back in hook lying x 8 min at end of treatment  Date 03/01/2022:  Nustep x 5 min (PT present to discuss progress and goals)  Standing hamstring stretch x 3 each LE holding 30 sec Standing quad/hip flexor stretch 3 x 30 sec each LE Standing adductor stretch 2 x 30 sec each side Seated piriformis stretch 2 x 30 sec each side Quadruped child's pose stretch x 5 with increasing hip abduction each rep holding 5-10 sec each Hooklying trunk rotation x 20 Bridge 2x10  Supine clamshell x 20 with yellow loop Sidelying clams with yellow loop x 20 both Sidelying hip IR with LE PPT x20   Date 02/27/2022:  Bike for 5 min, lvl 3 with PT present to get subjective.  Supine figure 4 stretch 2x 30 sec, bilat Bridge 2x10 with BTB Siedlying clams with BTB 2x30  LTR x20  PPT x20  TrA activation, 5 sec hold x10  DATE: 02/22/22 Initial eval completed and initiated HEP      PATIENT EDUCATION:  Education details: Initiated HEP Person educated: Patient Education method: Programmer, multimedia, Facilities manager, Verbal cues, and Handouts Education comprehension: verbalized understanding, returned demonstration, and verbal cues required   HOME EXERCISE PROGRAM: Access Code: [email or web address] URL: [email or web address] Date: 03/01/2022 Prepared by: Mikey Kirschner  Exercises - Seated Piriformis Stretch with Trunk  Bend  - 1 x daily - 7 x weekly - 1 sets - 3 reps - 30 hold - Supine Figure 4 Piriformis Stretch  - 1 x daily - 7 x weekly - 1 sets - 3 reps - 30 hold - Supine ITB Stretch with Strap  - 1 x daily - 7 x weekly - 1 sets - 3 reps - 30 hold - Supine Bridge  - 1 x daily - 7 x weekly - 2 sets - 10 reps - Clamshell  - 1 x daily - 7 x weekly - 2 sets - 10 reps - Supine Lower Trunk Rotation  - 1 x daily - 7 x weekly - 2 sets - 10 reps - Supine Posterior Pelvic Tilt  - 1 x daily - 7 x weekly - 2 sets - 10 reps - Standing Hamstring Stretch on Chair  - 1 x daily - 7 x weekly - 1 sets - 3 reps - 30 hold - Standing Quad Stretch with Table and Chair Support  - 1 x daily - 7 x weekly - 1 sets - 3 reps - 30 hold - Side Lunge Adductor Stretch  - 1 x daily - 7 x weekly - 1 sets - 2 reps - 20-30 sec hold  ASSESSMENT:   CLINICAL IMPRESSION: Kelsey Barnett is progressing appropriately.  She was able to do a cycling class last night but had some increase in pain with this.  However, she completed all tasks today with ease.    Pt will continue to benefit from skilled PT to address continued deficits.     OBJECTIVE IMPAIRMENTS: difficulty walking, decreased ROM, decreased strength, increased muscle spasms, impaired flexibility, and pain.    ACTIVITY LIMITATIONS: lifting, bending, squatting, sleeping, transfers, continence, and dressing   PARTICIPATION LIMITATIONS: meal prep, cleaning, laundry, shopping, community activity, and occupation   PERSONAL FACTORS: Fitness, Profession, and 1 comorbidity: pregnancy  are also affecting patient's functional outcome.    REHAB POTENTIAL: Excellent   CLINICAL DECISION MAKING: Stable/uncomplicated   EVALUATION COMPLEXITY: Low     GOALS: Goals reviewed with patient? Yes   SHORT TERM GOALS: Target date: 03/22/2022     Patient will be independent with initial HEP  Baseline: Goal status: MET   2.  Pain report to be no greater than 4/10  Baseline:  Goal status: MET     LONG  TERM GOALS: Target date: 04/19/2022     Patient to be independent with advanced HEP  Baseline:  Goal status: INITIAL   2.  Patient to report pain no greater than 2/10  Baseline:  Goal status: INITIAL   3.  FOTO score to be 66 Baseline:  Goal status: INITIAL   4.  Patient to report 85% improvement in symptoms Baseline:  Goal status: INITIAL     PLAN:   PT FREQUENCY: 1-2x/week   PT DURATION: 8 weeks   PLANNED INTERVENTIONS: Therapeutic exercises, Therapeutic activity, Neuromuscular re-education, Balance training, Gait training, Patient/Family education, Self Care, Joint mobilization, Aquatic Therapy, Dry Needling, Electrical stimulation, Spinal mobilization, Cryotherapy, Moist heat, Taping, Manual therapy, and Re-evaluation.   PLAN FOR NEXT  SESSION: Nustep, progress right hip stability training and core stabilization, along with hip mobility and pelvic floor training.    Victorino Dike B. Leomar Westberg, PT 03/09/22 8:51 AM  [facility] [address] [place], [place] [address] Phone # [phone] Fax [phone]

## 2022-03-15 ENCOUNTER — Ambulatory Visit: Payer: BC Managed Care – PPO

## 2022-03-15 DIAGNOSIS — O26892 Other specified pregnancy related conditions, second trimester: Secondary | ICD-10-CM | POA: Diagnosis not present

## 2022-03-15 DIAGNOSIS — M6281 Muscle weakness (generalized): Secondary | ICD-10-CM

## 2022-03-15 DIAGNOSIS — Z3A27 27 weeks gestation of pregnancy: Secondary | ICD-10-CM | POA: Diagnosis not present

## 2022-03-15 DIAGNOSIS — R262 Difficulty in walking, not elsewhere classified: Secondary | ICD-10-CM | POA: Diagnosis not present

## 2022-03-15 DIAGNOSIS — M5459 Other low back pain: Secondary | ICD-10-CM | POA: Diagnosis not present

## 2022-03-15 DIAGNOSIS — R252 Cramp and spasm: Secondary | ICD-10-CM

## 2022-03-15 NOTE — Therapy (Signed)
OUTPATIENT PHYSICAL THERAPY TREATMENT NOTE   Patient Name: Kelsey Barnett MRN: [id number] DOB:[birth year], 32 y.o., female Today's Date: 03/15/2022  PCP: [facility]  REFERRING PROVIDER: Arby Barrette, FNP   END OF SESSION:   PT End of Session - 03/15/22 0807     Visit Number 6    Date for PT Re-Evaluation 04/19/22    Authorization Type BLUE CROSS BLUE [employer]IELD    PT Start Time 0800    PT Stop Time 0845    PT Time Calculation (min) 45 min    Activity Tolerance Patient tolerated treatment well    Behavior During Therapy [facility] for tasks assessed/performed             History reviewed. No pertinent past medical history. Past Surgical History:  Procedure Laterality Date   BREAST REDUCTION SURGERY Bilateral 11/05/2019   Procedure: BILATERAL BREAST REDUCTION WITH LIPOSUCTION;  Surgeon: Peggye Form, DO;  Location: [facility];  Service: Plastics;  Laterality: Bilateral;   WISDOM TOOTH EXTRACTION     Patient Active Problem List   Diagnosis Date Noted   S/P bilateral breast reduction 11/13/2019   Symptomatic mammary hypertrophy 06/18/2019   Back pain 06/18/2019   Neck pain 06/18/2019    REFERRING DIAG: O26.899 (ICD-10-CM) - Other specified pregnancy related conditions, unspecified trimester   THERAPY DIAG:  Other low back pain  Muscle weakness (generalized)  Cramp and spasm  Difficulty in walking, not elsewhere classified  Rationale for Evaluation and Treatment Rehabilitation  PERTINENT HISTORY: [redacted] weeks gestation   PRECAUTIONS: Other: pregnancy   SUBJECTIVE:                                                                                                                                                                               SUBJECTIVE STATEMENT:  Patient reports pain is no worse, just off and on and annoying.  "The therapy is helping.  It's not as bad as when I started."   PAIN:  Are you having pain? Yes: NPRS scale: 4/10 Pain  location: right low back and hip Pain description: aching Aggravating factors: start up, walking Relieving factors: ice, heat Patient states the pain can get up to 7/10  OBJECTIVE:    DIAGNOSTIC FINDINGS:  na   PATIENT SURVEYS:  FOTO 49, predicted 5   SCREENING FOR RED FLAGS: Bowel or bladder incontinence: No Spinal tumors: No Cauda equina syndrome: No Compression fracture: No Abdominal aneurysm: No   COGNITION: Overall cognitive status: Within functional limits for tasks assessed                          SENSATION: WFL   MUSCLE LENGTH:  Hamstrings: Right WNL ; Left WNL  Thomas test: Right neg ; Left neg    POSTURE: No Significant postural limitations     LUMBAR ROM:    AROM eval  Flexion Pain begins at fingertips just above knee but she is able to go to toes  Extension WNL  Right lateral flexion Fingertips just above joint line  Left lateral flexion Fingertips to joint line  Right rotation WNL  Left rotation WNL   (Blank rows = not tested)   LOWER EXTREMITY ROM:      WNL   LOWER EXTREMITY MMT:     5/5 throughout   LUMBAR SPECIAL TESTS:  Straight leg raise test: Negative     GAIT: Distance walked: 30 Assistive device utilized: None Level of assistance: Complete Independence Comments: antalgic   TODAY'S TREATMENT:  Date 03/15/2022:  Foam rolling to right low back and hip x 2 min using blue foam roller Supine Piriformis stretch 3 x 20 sec each side Supine IT band/hip stretch 3 x 20 sec each side Isometric hip adduction x 10 hold 5 sec Isometric hip abduction x 10 hold 5 sec Supine clamshell x 20 with yellow loop Sidelying hip IR with blue foam roller between knees x 20 each side Ice pack to low back in hook lying x 8 min at end of treatment  Date 03/09/2022:  Nustep x 5 min (PT present to discuss progress and goals)  Supine Piriformis stretch 3 x 20 sec each side Supine IT band/hip stretch 3 x 20 sec each side Quadruped fire hydrant x 20  each side Quadruped child's pose stretch x 5 with increasing hip abduction each rep holding 5-10 sec each Quadruped bird dog x 20 each side Lateral band walks with yellow band 3 laps of 10 steps  Supine clamshell x 20 with yellow loop Sidelying hip IR with blue foam roller between knees x 20 each side Ice pack to low back in hook lying x 8 min at end of treatment  Date 03/07/2022:  Nustep x 5 min (PT present to discuss progress and goals)  Standing hamstring stretch x 3 each LE holding 30 sec Standing quad/hip flexor stretch 3 x 30 sec each LE Standing adductor stretch 2 x 30 sec each side Supine clamshell x 20 with yellow loop Sidelying clams with yellow loop x 20 both Sidelying hip IR with ball between knees x 20 each side Quadruped child's pose stretch x 5 with increasing hip abduction each rep holding 5-10 sec each Seated piriformis stretch 3 x 30 sec each side Icepack to low back in hook lying x 8 min at end of treatment    PATIENT EDUCATION:  Education details: Initiated HEP Person educated: Patient Education method: Programmer, multimedia, Facilities manager, Verbal cues, and Handouts Education comprehension: verbalized understanding, returned demonstration, and verbal cues required   HOME EXERCISE PROGRAM: Access Code: [email or web address] URL: [email or web address] Date: 03/01/2022 Prepared by: Mikey Kirschner  Exercises - Seated Piriformis Stretch with Trunk Bend  - 1 x daily - 7 x weekly - 1 sets - 3 reps - 30 hold - Supine Figure 4 Piriformis Stretch  - 1 x daily - 7 x weekly - 1 sets - 3 reps - 30 hold - Supine ITB Stretch with Strap  - 1 x daily - 7 x weekly - 1 sets - 3 reps - 30 hold - Supine Bridge  - 1 x daily - 7 x weekly - 2 sets - 10 reps - Clamshell  - 1 x daily -  7 x weekly - 2 sets - 10 reps - Supine Lower Trunk Rotation  - 1 x daily - 7 x weekly - 2 sets - 10 reps - Supine Posterior Pelvic Tilt  - 1 x daily - 7 x weekly - 2 sets - 10 reps - Standing Hamstring  Stretch on Chair  - 1 x daily - 7 x weekly - 1 sets - 3 reps - 30 hold - Standing Quad Stretch with Table and Chair Support  - 1 x daily - 7 x weekly - 1 sets - 3 reps - 30 hold - Side Lunge Adductor Stretch  - 1 x daily - 7 x weekly - 1 sets - 2 reps - 20-30 sec hold  ASSESSMENT:   CLINICAL IMPRESSION: Atonya's pain is not worsening and has improved since beginning PT.  She continues to have right side low back and hip discomfort.   Pt will continue to benefit from skilled PT to address continued deficits.     OBJECTIVE IMPAIRMENTS: difficulty walking, decreased ROM, decreased strength, increased muscle spasms, impaired flexibility, and pain.    ACTIVITY LIMITATIONS: lifting, bending, squatting, sleeping, transfers, continence, and dressing   PARTICIPATION LIMITATIONS: meal prep, cleaning, laundry, shopping, community activity, and occupation   PERSONAL FACTORS: Fitness, Profession, and 1 comorbidity: pregnancy  are also affecting patient's functional outcome.    REHAB POTENTIAL: Excellent   CLINICAL DECISION MAKING: Stable/uncomplicated   EVALUATION COMPLEXITY: Low     GOALS: Goals reviewed with patient? Yes   SHORT TERM GOALS: Target date: 03/22/2022     Patient will be independent with initial HEP  Baseline: Goal status: MET   2.  Pain report to be no greater than 4/10  Baseline:  Goal status: MET     LONG TERM GOALS: Target date: 04/19/2022     Patient to be independent with advanced HEP  Baseline:  Goal status: INITIAL   2.  Patient to report pain no greater than 2/10  Baseline:  Goal status: INITIAL   3.  FOTO score to be 66 Baseline:  Goal status: INITIAL   4.  Patient to report 85% improvement in symptoms Baseline:  Goal status: INITIAL     PLAN:   PT FREQUENCY: 1-2x/week   PT DURATION: 8 weeks   PLANNED INTERVENTIONS: Therapeutic exercises, Therapeutic activity, Neuromuscular re-education, Balance training, Gait training, Patient/Family education,  Self Care, Joint mobilization, Aquatic Therapy, Dry Needling, Electrical stimulation, Spinal mobilization, Cryotherapy, Moist heat, Taping, Manual therapy, and Re-evaluation.   PLAN FOR NEXT SESSION: Nustep, progress right hip stability training and core stabilization, along with hip mobility and pelvic floor training.    Victorino Dike B. Derrell Milanes, PT 03/15/22 8:43 AM  [facility] [address], [address] [place], [place] [address] Phone # [phone] Fax [phone]

## 2022-03-17 ENCOUNTER — Ambulatory Visit: Payer: BC Managed Care – PPO | Attending: Obstetrics and Gynecology

## 2022-03-17 DIAGNOSIS — R262 Difficulty in walking, not elsewhere classified: Secondary | ICD-10-CM | POA: Insufficient documentation

## 2022-03-17 DIAGNOSIS — R252 Cramp and spasm: Secondary | ICD-10-CM | POA: Insufficient documentation

## 2022-03-17 DIAGNOSIS — M6281 Muscle weakness (generalized): Secondary | ICD-10-CM | POA: Diagnosis not present

## 2022-03-17 DIAGNOSIS — M5459 Other low back pain: Secondary | ICD-10-CM | POA: Insufficient documentation

## 2022-03-17 NOTE — Therapy (Signed)
OUTPATIENT PHYSICAL THERAPY TREATMENT NOTE   Patient Name: Kelsey Barnett MRN: [id number] DOB:04/09/1990, 32 y.o., female Today's Date: 03/17/2022  PCP: [facility]  REFERRING PROVIDER: Arby Barrette, FNP   END OF SESSION:   PT End of Session - 03/17/22 0801     Visit Number 7    Date for PT Re-Evaluation 04/19/22    Authorization Type BLUE CROSS BLUE [employer]IELD    PT Start Time 0800    PT Stop Time 0838    PT Time Calculation (min) 38 min    Activity Tolerance Patient tolerated treatment well    Behavior During Therapy [facility] for tasks assessed/performed             History reviewed. No pertinent past medical history. Past Surgical History:  Procedure Laterality Date   BREAST REDUCTION SURGERY Bilateral 11/05/2019   Procedure: BILATERAL BREAST REDUCTION WITH LIPOSUCTION;  Surgeon: Kelsey Form, DO;  Location: [facility];  Service: Plastics;  Laterality: Bilateral;   WISDOM TOOTH EXTRACTION     Patient Active Problem List   Diagnosis Date Noted   S/P bilateral breast reduction 11/13/2019   Symptomatic mammary hypertrophy 06/18/2019   Back pain 06/18/2019   Neck pain 06/18/2019    REFERRING DIAG: O26.899 (ICD-10-CM) - Other specified pregnancy related conditions, unspecified trimester   THERAPY DIAG:  Other low back pain  Muscle weakness (generalized)  Cramp and spasm  Difficulty in walking, not elsewhere classified  Rationale for Evaluation and Treatment Rehabilitation  PERTINENT HISTORY: [redacted] weeks gestation   PRECAUTIONS: Other: pregnancy   SUBJECTIVE:                                                                                                                                                                               SUBJECTIVE STATEMENT:  Patient reports pain better today.  Reported at 2/10.     PAIN:  Are you having pain? Yes: NPRS scale: 2/10 Pain location: right low back and hip Pain description: aching Aggravating  factors: start up, walking Relieving factors: ice, heat Patient states the pain can get up to 7/10  OBJECTIVE:    DIAGNOSTIC FINDINGS:  na   PATIENT SURVEYS:  FOTO 49, predicted 59   SCREENING FOR RED FLAGS: Bowel or bladder incontinence: No Spinal tumors: No Cauda equina syndrome: No Compression fracture: No Abdominal aneurysm: No   COGNITION: Overall cognitive status: Within functional limits for tasks assessed                          SENSATION: WFL   MUSCLE LENGTH: Hamstrings: Right WNL ; Left WNL  Thomas test: Right neg ; Left neg  POSTURE: No Significant postural limitations     LUMBAR ROM:    AROM eval  Flexion Pain begins at fingertips just above knee but she is able to go to toes  Extension WNL  Right lateral flexion Fingertips just above joint line  Left lateral flexion Fingertips to joint line  Right rotation WNL  Left rotation WNL   (Blank rows = not tested)   LOWER EXTREMITY ROM:      WNL   LOWER EXTREMITY MMT:     5/5 throughout   LUMBAR SPECIAL TESTS:  Straight leg raise test: Negative     GAIT: Distance walked: 30 Assistive device utilized: None Level of assistance: Complete Independence Comments: antalgic   TODAY'S TREATMENT:  Date 03/17/2022:  Nustep x 5 min, level 5 Foam rolling to right low back and hip x 2 min using white foam roller Supine Piriformis stretch 3 x 20 sec each side Supine IT band/hip stretch 3 x 20 sec each side Isometric hip adduction x 10 hold 5 sec Isometric hip abduction x 10 hold 5 sec Supine clamshell x 20 with red loop Sidelying clamshell with red loop x 20 each side Ice pack to low back in hook lying x 8 min at end of treatment  Date 03/15/2022:  Foam rolling to right low back and hip x 2 min using blue foam roller Supine Piriformis stretch 3 x 20 sec each side Supine IT band/hip stretch 3 x 20 sec each side Isometric hip adduction x 10 hold 5 sec Isometric hip abduction x 10 hold 5 sec Supine  clamshell x 20 with yellow loop Sidelying hip IR with blue foam roller between knees x 20 each side Ice pack to low back in hook lying x 8 min at end of treatment  Date 03/09/2022:  Nustep x 5 min (PT present to discuss progress and goals)  Supine Piriformis stretch 3 x 20 sec each side Supine IT band/hip stretch 3 x 20 sec each side Quadruped fire hydrant x 20 each side Quadruped child's pose stretch x 5 with increasing hip abduction each rep holding 5-10 sec each Quadruped bird dog x 20 each side Lateral band walks with yellow band 3 laps of 10 steps  Supine clamshell x 20 with yellow loop Sidelying hip IR with blue foam roller between knees x 20 each side Ice pack to low back in hook lying x 8 min at end of treatment    PATIENT EDUCATION:  Education details: Initiated HEP Person educated: Patient Education method: Programmer, multimedia, Facilities manager, Verbal cues, and Handouts Education comprehension: verbalized understanding, returned demonstration, and verbal cues required   HOME EXERCISE PROGRAM: Access Code: [email or web address] URL: [email or web address] Date: 03/01/2022 Prepared by: Mikey Kirschner  Exercises - Seated Piriformis Stretch with Trunk Bend  - 1 x daily - 7 x weekly - 1 sets - 3 reps - 30 hold - Supine Figure 4 Piriformis Stretch  - 1 x daily - 7 x weekly - 1 sets - 3 reps - 30 hold - Supine ITB Stretch with Strap  - 1 x daily - 7 x weekly - 1 sets - 3 reps - 30 hold - Supine Bridge  - 1 x daily - 7 x weekly - 2 sets - 10 reps - Clamshell  - 1 x daily - 7 x weekly - 2 sets - 10 reps - Supine Lower Trunk Rotation  - 1 x daily - 7 x weekly - 2 sets - 10 reps - Supine Posterior Pelvic Tilt  -  1 x daily - 7 x weekly - 2 sets - 10 reps - Standing Hamstring Stretch on Chair  - 1 x daily - 7 x weekly - 1 sets - 3 reps - 30 hold - Standing Quad Stretch with Table and Chair Support  - 1 x daily - 7 x weekly - 1 sets - 3 reps - 30 hold - Side Lunge Adductor Stretch  - 1 x  daily - 7 x weekly - 1 sets - 2 reps - 20-30 sec hold  ASSESSMENT:   CLINICAL IMPRESSION: Audre is progressing appropriately.  She is compliant with her HEP and well motivated.  She had a bit of a breakthrough with her pain as of this morning.  She should continue to do well.   Pt will continue to benefit from skilled PT to address continued deficits.     OBJECTIVE IMPAIRMENTS: difficulty walking, decreased ROM, decreased strength, increased muscle spasms, impaired flexibility, and pain.    ACTIVITY LIMITATIONS: lifting, bending, squatting, sleeping, transfers, continence, and dressing   PARTICIPATION LIMITATIONS: meal prep, cleaning, laundry, shopping, community activity, and occupation   PERSONAL FACTORS: Fitness, Profession, and 1 comorbidity: pregnancy  are also affecting patient's functional outcome.    REHAB POTENTIAL: Excellent   CLINICAL DECISION MAKING: Stable/uncomplicated   EVALUATION COMPLEXITY: Low     GOALS: Goals reviewed with patient? Yes   SHORT TERM GOALS: Target date: 03/22/2022     Patient will be independent with initial HEP  Baseline: Goal status: MET   2.  Pain report to be no greater than 4/10  Baseline:  Goal status: MET     LONG TERM GOALS: Target date: 04/19/2022     Patient to be independent with advanced HEP  Baseline:  Goal status: INITIAL   2.  Patient to report pain no greater than 2/10  Baseline:  Goal status: INITIAL   3.  FOTO score to be 66 Baseline:  Goal status: INITIAL   4.  Patient to report 85% improvement in symptoms Baseline:  Goal status: INITIAL     PLAN:   PT FREQUENCY: 1-2x/week   PT DURATION: 8 weeks   PLANNED INTERVENTIONS: Therapeutic exercises, Therapeutic activity, Neuromuscular re-education, Balance training, Gait training, Patient/Family education, Self Care, Joint mobilization, Aquatic Therapy, Dry Needling, Electrical stimulation, Spinal mobilization, Cryotherapy, Moist heat, Taping, Manual therapy, and  Re-evaluation.   PLAN FOR NEXT SESSION: Nustep, progress right hip stability training and core stabilization, along with hip mobility and pelvic floor training.    Victorino Dike B. Nader Boys, PT 03/17/22 8:35 AM  [facility] [address] [place], [place] [address] Phone # [phone] Fax [phone]

## 2022-03-21 ENCOUNTER — Ambulatory Visit: Payer: BC Managed Care – PPO

## 2022-03-21 DIAGNOSIS — M5459 Other low back pain: Secondary | ICD-10-CM | POA: Diagnosis not present

## 2022-03-21 DIAGNOSIS — M6281 Muscle weakness (generalized): Secondary | ICD-10-CM

## 2022-03-21 DIAGNOSIS — R252 Cramp and spasm: Secondary | ICD-10-CM

## 2022-03-21 DIAGNOSIS — R262 Difficulty in walking, not elsewhere classified: Secondary | ICD-10-CM | POA: Diagnosis not present

## 2022-03-21 NOTE — Therapy (Signed)
OUTPATIENT PHYSICAL THERAPY TREATMENT NOTE   Patient Name: Kelsey Barnett MRN: [id number] DOB:03-01-1990, 32 y.o., female Today's Date: 03/21/2022  PCP: [facility]  REFERRING PROVIDER: Arby Barrette, FNP   END OF SESSION:   PT End of Session - 03/21/22 0832     Visit Number 8    Date for PT Re-Evaluation 04/19/22    Authorization Type BLUE CROSS BLUE [employer]IELD    PT Start Time 0805    PT Stop Time 0849    PT Time Calculation (min) 44 min    Activity Tolerance Patient tolerated treatment well    Behavior During Therapy [facility] for tasks assessed/performed              History reviewed. No pertinent past medical history. Past Surgical History:  Procedure Laterality Date   BREAST REDUCTION SURGERY Bilateral 11/05/2019   Procedure: BILATERAL BREAST REDUCTION WITH LIPOSUCTION;  Surgeon: Peggye Form, DO;  Location: [facility];  Service: Plastics;  Laterality: Bilateral;   WISDOM TOOTH EXTRACTION     Patient Active Problem List   Diagnosis Date Noted   S/P bilateral breast reduction 11/13/2019   Symptomatic mammary hypertrophy 06/18/2019   Back pain 06/18/2019   Neck pain 06/18/2019    REFERRING DIAG: O26.899 (ICD-10-CM) - Other specified pregnancy related conditions, unspecified trimester   THERAPY DIAG:  Other low back pain  Muscle weakness (generalized)  Cramp and spasm  Difficulty in walking, not elsewhere classified  Rationale for Evaluation and Treatment Rehabilitation  PERTINENT HISTORY: [redacted] weeks gestation   PRECAUTIONS: Other: pregnancy   SUBJECTIVE:                                                                                                                                                                               SUBJECTIVE STATEMENT:  "Doing pretty good".  Reported at 2/10.     PAIN:  Are you having pain? Yes: NPRS scale: 2/10 Pain location: right low back and hip Pain description: aching Aggravating factors: start  up, walking Relieving factors: ice, heat Patient states the pain can get up to 7/10  OBJECTIVE:    DIAGNOSTIC FINDINGS:  na   PATIENT SURVEYS:  FOTO 49, predicted 3   SCREENING FOR RED FLAGS: Bowel or bladder incontinence: No Spinal tumors: No Cauda equina syndrome: No Compression fracture: No Abdominal aneurysm: No   COGNITION: Overall cognitive status: Within functional limits for tasks assessed                          SENSATION: WFL   MUSCLE LENGTH: Hamstrings: Right WNL ; Left WNL  Thomas test: Right neg ; Left neg  POSTURE: No Significant postural limitations     LUMBAR ROM:    AROM eval  Flexion Pain begins at fingertips just above knee but she is able to go to toes  Extension WNL  Right lateral flexion Fingertips just above joint line  Left lateral flexion Fingertips to joint line  Right rotation WNL  Left rotation WNL   (Blank rows = not tested)   LOWER EXTREMITY ROM:      WNL   LOWER EXTREMITY MMT:     5/5 throughout   LUMBAR SPECIAL TESTS:  Straight leg raise test: Negative     GAIT: Distance walked: 30 Assistive device utilized: None Level of assistance: Complete Independence Comments: antalgic   TODAY'S TREATMENT:  Date 03/21/2022:  Nustep x 5 min, level 5 Foam rolling to right low back and hip x 2 min using white foam roller Supine Piriformis stretch 3 x 20 sec each side Supine IT band/hip stretch 3 x 20 sec each side Isometric hip adduction x 10 hold 5 sec Isometric hip abduction x 10 hold 5 sec Supine clamshell x 20 with red loop Sidelying clamshell with red loop x 20 each side Quadruped fire hydrant x 20 bilaterally Quadruped childs pose with increasing hip abduction each rep x 5, hold 10 sec Ice pack to low back in hook lying x 8 min at end of treatment  Date 03/17/2022:  Nustep x 5 min, level 5 Foam rolling to right low back and hip x 2 min using white foam roller Supine Piriformis stretch 3 x 20 sec each side Supine  IT band/hip stretch 3 x 20 sec each side Isometric hip adduction x 10 hold 5 sec Isometric hip abduction x 10 hold 5 sec Supine clamshell x 20 with red loop Sidelying clamshell with red loop x 20 each side Ice pack to low back in hook lying x 8 min at end of treatment  Date 03/15/2022:  Foam rolling to right low back and hip x 2 min using blue foam roller Supine Piriformis stretch 3 x 20 sec each side Supine IT band/hip stretch 3 x 20 sec each side Isometric hip adduction x 10 hold 5 sec Isometric hip abduction x 10 hold 5 sec Supine clamshell x 20 with yellow loop Sidelying hip IR with blue foam roller between knees x 20 each side Ice pack to low back in hook lying x 8 min at end of treatment    PATIENT EDUCATION:  Education details: Initiated HEP Person educated: Patient Education method: Programmer, multimedia, Facilities manager, Verbal cues, and Handouts Education comprehension: verbalized understanding, returned demonstration, and verbal cues required   HOME EXERCISE PROGRAM: Access Code: [email or web address] URL: [email or web address] Date: 03/01/2022 Prepared by: Mikey Kirschner  Exercises - Seated Piriformis Stretch with Trunk Bend  - 1 x daily - 7 x weekly - 1 sets - 3 reps - 30 hold - Supine Figure 4 Piriformis Stretch  - 1 x daily - 7 x weekly - 1 sets - 3 reps - 30 hold - Supine ITB Stretch with Strap  - 1 x daily - 7 x weekly - 1 sets - 3 reps - 30 hold - Supine Bridge  - 1 x daily - 7 x weekly - 2 sets - 10 reps - Clamshell  - 1 x daily - 7 x weekly - 2 sets - 10 reps - Supine Lower Trunk Rotation  - 1 x daily - 7 x weekly - 2 sets - 10 reps - Supine Posterior Pelvic Tilt  -  1 x daily - 7 x weekly - 2 sets - 10 reps - Standing Hamstring Stretch on Chair  - 1 x daily - 7 x weekly - 1 sets - 3 reps - 30 hold - Standing Quad Stretch with Table and Chair Support  - 1 x daily - 7 x weekly - 1 sets - 3 reps - 30 hold - Side Lunge Adductor Stretch  - 1 x daily - 7 x weekly - 1 sets  - 2 reps - 20-30 sec hold  ASSESSMENT:   CLINICAL IMPRESSION: Kelsey Barnett is doing well. She is able to do all tasks today without increased pain.  Pt will continue to benefit from skilled PT to address continued deficits.     OBJECTIVE IMPAIRMENTS: difficulty walking, decreased ROM, decreased strength, increased muscle spasms, impaired flexibility, and pain.    ACTIVITY LIMITATIONS: lifting, bending, squatting, sleeping, transfers, continence, and dressing   PARTICIPATION LIMITATIONS: meal prep, cleaning, laundry, shopping, community activity, and occupation   PERSONAL FACTORS: Fitness, Profession, and 1 comorbidity: pregnancy  are also affecting patient's functional outcome.    REHAB POTENTIAL: Excellent   CLINICAL DECISION MAKING: Stable/uncomplicated   EVALUATION COMPLEXITY: Low     GOALS: Goals reviewed with patient? Yes   SHORT TERM GOALS: Target date: 03/22/2022     Patient will be independent with initial HEP  Baseline: Goal status: MET   2.  Pain report to be no greater than 4/10  Baseline:  Goal status: MET     LONG TERM GOALS: Target date: 04/19/2022     Patient to be independent with advanced HEP  Baseline:  Goal status: INITIAL   2.  Patient to report pain no greater than 2/10  Baseline:  Goal status: INITIAL   3.  FOTO score to be 66 Baseline:  Goal status: INITIAL   4.  Patient to report 85% improvement in symptoms Baseline:  Goal status: INITIAL     PLAN:   PT FREQUENCY: 1-2x/week   PT DURATION: 8 weeks   PLANNED INTERVENTIONS: Therapeutic exercises, Therapeutic activity, Neuromuscular re-education, Balance training, Gait training, Patient/Family education, Self Care, Joint mobilization, Aquatic Therapy, Dry Needling, Electrical stimulation, Spinal mobilization, Cryotherapy, Moist heat, Taping, Manual therapy, and Re-evaluation.   PLAN FOR NEXT SESSION: Nustep, progress right hip stability training and core stabilization, along with hip mobility  and pelvic floor training.    Victorino Dike B. Merlie Noga, PT 03/21/22 8:48 AM  [facility] [address], [address] [place], [place] [address] Phone # [phone] Fax [phone]

## 2022-03-24 ENCOUNTER — Ambulatory Visit: Payer: BC Managed Care – PPO

## 2022-03-24 DIAGNOSIS — M5459 Other low back pain: Secondary | ICD-10-CM

## 2022-03-24 DIAGNOSIS — R252 Cramp and spasm: Secondary | ICD-10-CM

## 2022-03-24 DIAGNOSIS — M6281 Muscle weakness (generalized): Secondary | ICD-10-CM | POA: Diagnosis not present

## 2022-03-24 DIAGNOSIS — R262 Difficulty in walking, not elsewhere classified: Secondary | ICD-10-CM

## 2022-03-24 NOTE — Therapy (Signed)
OUTPATIENT PHYSICAL THERAPY TREATMENT NOTE   Patient Name: Kelsey Barnett MRN: [id number] DOB:02/16/1990, 32 y.o., female Today's Date: 03/24/2022  PCP: [facility]  REFERRING PROVIDER: Arby Barrette, FNP   END OF SESSION:   PT End of Session - 03/24/22 0810     Visit Number 8    Date for PT Re-Evaluation 04/19/22    Authorization Type BLUE CROSS BLUE [employer]IELD    PT Start Time 0800    PT Stop Time 0847    PT Time Calculation (min) 47 min    Activity Tolerance Patient tolerated treatment well    Behavior During Therapy [facility] for tasks assessed/performed              History reviewed. No pertinent past medical history. Past Surgical History:  Procedure Laterality Date   BREAST REDUCTION SURGERY Bilateral 11/05/2019   Procedure: BILATERAL BREAST REDUCTION WITH LIPOSUCTION;  Surgeon: Peggye Form, DO;  Location: [facility];  Service: Plastics;  Laterality: Bilateral;   WISDOM TOOTH EXTRACTION     Patient Active Problem List   Diagnosis Date Noted   S/P bilateral breast reduction 11/13/2019   Symptomatic mammary hypertrophy 06/18/2019   Back pain 06/18/2019   Neck pain 06/18/2019    REFERRING DIAG: O26.899 (ICD-10-CM) - Other specified pregnancy related conditions, unspecified trimester   THERAPY DIAG:  Other low back pain  Muscle weakness (generalized)  Cramp and spasm  Difficulty in walking, not elsewhere classified  Rationale for Evaluation and Treatment Rehabilitation  PERTINENT HISTORY: [redacted] weeks gestation   PRECAUTIONS: Other: pregnancy   SUBJECTIVE:                                                                                                                                                                               SUBJECTIVE STATEMENT:  No problems.  Pain is well controlled.  2/10.    PAIN:  Are you having pain? Yes: NPRS scale: 2/10 Pain location: right low back and hip Pain description: aching Aggravating factors:  start up, walking Relieving factors: ice, heat Patient states the pain can get up to 7/10  OBJECTIVE:    DIAGNOSTIC FINDINGS:  na   PATIENT SURVEYS:  FOTO 49, predicted 61   SCREENING FOR RED FLAGS: Bowel or bladder incontinence: No Spinal tumors: No Cauda equina syndrome: No Compression fracture: No Abdominal aneurysm: No   COGNITION: Overall cognitive status: Within functional limits for tasks assessed                          SENSATION: WFL   MUSCLE LENGTH: Hamstrings: Right WNL ; Left WNL  Thomas test: Right neg ; Left neg  POSTURE: No Significant postural limitations     LUMBAR ROM:    AROM eval  Flexion Pain begins at fingertips just above knee but she is able to go to toes  Extension WNL  Right lateral flexion Fingertips just above joint line  Left lateral flexion Fingertips to joint line  Right rotation WNL  Left rotation WNL   (Blank rows = not tested)   LOWER EXTREMITY ROM:      WNL   LOWER EXTREMITY MMT:     5/5 throughout   LUMBAR SPECIAL TESTS:  Straight leg raise test: Negative     GAIT: Distance walked: 30 Assistive device utilized: None Level of assistance: Complete Independence Comments: antalgic   TODAY'S TREATMENT:  Date 03/24/2022:  Nustep x 5 min, level 5 Foam rolling to right low back and hip x 2 min using white foam roller Supine Piriformis stretch 3 x 20 sec each side Supine IT band/hip stretch 3 x 20 sec each side Isometric hip adduction x 10 hold 5 sec Isometric hip abduction x 10 hold 5 sec Supine clamshell x 20 with red loop Sidelying clamshell with red loop x 20 each side Quadruped fire hydrant x 20 bilaterally Quadruped childs pose with increasing hip abduction each rep x 5, hold 10 sec Ice pack to low back in hook lying x 8 min at end of treatment  Date 03/21/2022:  Nustep x 5 min, level 5 Foam rolling to right low back and hip x 2 min using white foam roller Supine Piriformis stretch 3 x 20 sec each  side Supine IT band/hip stretch 3 x 20 sec each side Isometric hip adduction x 10 hold 5 sec Isometric hip abduction x 10 hold 5 sec Supine clamshell x 20 with red loop Sidelying clamshell with red loop x 20 each side Quadruped fire hydrant x 20 bilaterally Quadruped childs pose with increasing hip abduction each rep x 5, hold 10 sec Ice pack to low back in hook lying x 8 min at end of treatment  Date 03/17/2022:  Nustep x 5 min, level 5 Foam rolling to right low back and hip x 2 min using white foam roller Supine Piriformis stretch 3 x 20 sec each side Supine IT band/hip stretch 3 x 20 sec each side Isometric hip adduction x 10 hold 5 sec Isometric hip abduction x 10 hold 5 sec Supine clamshell x 20 with red loop Sidelying clamshell with red loop x 20 each side Ice pack to low back in hook lying x 8 min at end of treatment    PATIENT EDUCATION:  Education details: Initiated HEP Person educated: Patient Education method: Programmer, multimedia, Facilities manager, Verbal cues, and Handouts Education comprehension: verbalized understanding, returned demonstration, and verbal cues required   HOME EXERCISE PROGRAM: Access Code: [email or web address] URL: [email or web address] Date: 03/01/2022 Prepared by: Mikey Kirschner  Exercises - Seated Piriformis Stretch with Trunk Bend  - 1 x daily - 7 x weekly - 1 sets - 3 reps - 30 hold - Supine Figure 4 Piriformis Stretch  - 1 x daily - 7 x weekly - 1 sets - 3 reps - 30 hold - Supine ITB Stretch with Strap  - 1 x daily - 7 x weekly - 1 sets - 3 reps - 30 hold - Supine Bridge  - 1 x daily - 7 x weekly - 2 sets - 10 reps - Clamshell  - 1 x daily - 7 x weekly - 2 sets - 10 reps - Supine Lower Trunk Rotation  -  1 x daily - 7 x weekly - 2 sets - 10 reps - Supine Posterior Pelvic Tilt  - 1 x daily - 7 x weekly - 2 sets - 10 reps - Standing Hamstring Stretch on Chair  - 1 x daily - 7 x weekly - 1 sets - 3 reps - 30 hold - Standing Quad Stretch with Table  and Chair Support  - 1 x daily - 7 x weekly - 1 sets - 3 reps - 30 hold - Side Lunge Adductor Stretch  - 1 x daily - 7 x weekly - 1 sets - 2 reps - 20-30 sec hold  ASSESSMENT:   CLINICAL IMPRESSION: Kelsey Barnett is progressing appropriately and meeting goals on time.   Pt will continue to benefit from skilled PT to address continued deficits.     OBJECTIVE IMPAIRMENTS: difficulty walking, decreased ROM, decreased strength, increased muscle spasms, impaired flexibility, and pain.    ACTIVITY LIMITATIONS: lifting, bending, squatting, sleeping, transfers, continence, and dressing   PARTICIPATION LIMITATIONS: meal prep, cleaning, laundry, shopping, community activity, and occupation   PERSONAL FACTORS: Fitness, Profession, and 1 comorbidity: pregnancy  are also affecting patient's functional outcome.    REHAB POTENTIAL: Excellent   CLINICAL DECISION MAKING: Stable/uncomplicated   EVALUATION COMPLEXITY: Low     GOALS: Goals reviewed with patient? Yes   SHORT TERM GOALS: Target date: 03/22/2022     Patient will be independent with initial HEP  Baseline: Goal status: MET   2.  Pain report to be no greater than 4/10  Baseline:  Goal status: MET     LONG TERM GOALS: Target date: 04/19/2022     Patient to be independent with advanced HEP  Baseline:  Goal status: INITIAL   2.  Patient to report pain no greater than 2/10  Baseline:  Goal status: MET   3.  FOTO score to be 66 Baseline:  Goal status: INITIAL   4.  Patient to report 85% improvement in symptoms Baseline:  Goal status: INITIAL     PLAN:   PT FREQUENCY: 1-2x/week   PT DURATION: 8 weeks   PLANNED INTERVENTIONS: Therapeutic exercises, Therapeutic activity, Neuromuscular re-education, Balance training, Gait training, Patient/Family education, Self Care, Joint mobilization, Aquatic Therapy, Dry Needling, Electrical stimulation, Spinal mobilization, Cryotherapy, Moist heat, Taping, Manual therapy, and Re-evaluation.    PLAN FOR NEXT SESSION: Nustep, progress right hip stability training and core stabilization, along with hip mobility and pelvic floor training.    Victorino Dike B. Yehudah Standing, PT 03/24/22 8:55 AM  [facility] [address], [address] [place], [place] [address] Phone # [phone] Fax [phone]

## 2022-03-31 ENCOUNTER — Ambulatory Visit: Payer: BC Managed Care – PPO

## 2022-03-31 DIAGNOSIS — R252 Cramp and spasm: Secondary | ICD-10-CM

## 2022-03-31 DIAGNOSIS — R262 Difficulty in walking, not elsewhere classified: Secondary | ICD-10-CM

## 2022-03-31 DIAGNOSIS — M5459 Other low back pain: Secondary | ICD-10-CM | POA: Diagnosis not present

## 2022-03-31 DIAGNOSIS — M6281 Muscle weakness (generalized): Secondary | ICD-10-CM | POA: Diagnosis not present

## 2022-03-31 NOTE — Therapy (Signed)
OUTPATIENT PHYSICAL THERAPY TREATMENT NOTE   Patient Name: Kelsey Barnett MRN: [id number] DOB:May 13, 1990, 32 y.o., female Today's Date: 03/31/2022  PCP: [facility]  REFERRING PROVIDER: Arby Barrette, FNP   END OF SESSION:   PT End of Session - 03/31/22 0806     Visit Number 9    Date for PT Re-Evaluation 04/19/22    Authorization Type BLUE CROSS BLUE [employer]IELD    PT Start Time 0803    PT Stop Time 0842    PT Time Calculation (min) 39 min    Activity Tolerance Patient tolerated treatment well    Behavior During Therapy [facility] for tasks assessed/performed              History reviewed. No pertinent past medical history. Past Surgical History:  Procedure Laterality Date   BREAST REDUCTION SURGERY Bilateral 11/05/2019   Procedure: BILATERAL BREAST REDUCTION WITH LIPOSUCTION;  Surgeon: Peggye Form, DO;  Location: [facility];  Service: Plastics;  Laterality: Bilateral;   WISDOM TOOTH EXTRACTION     Patient Active Problem List   Diagnosis Date Noted   S/P bilateral breast reduction 11/13/2019   Symptomatic mammary hypertrophy 06/18/2019   Back pain 06/18/2019   Neck pain 06/18/2019    REFERRING DIAG: O26.899 (ICD-10-CM) - Other specified pregnancy related conditions, unspecified trimester   THERAPY DIAG:  Other low back pain  Muscle weakness (generalized)  Cramp and spasm  Difficulty in walking, not elsewhere classified  Rationale for Evaluation and Treatment Rehabilitation  PERTINENT HISTORY: [redacted] weeks gestation   PRECAUTIONS: Other: pregnancy   SUBJECTIVE:                                                                                                                                                                               SUBJECTIVE STATEMENT:  No problems.  Pain is well controlled.  2/10.    PAIN:  Are you having pain? Yes: NPRS scale: 2/10 Pain location: right low back and hip Pain description: aching Aggravating factors:  start up, walking Relieving factors: ice, heat Patient states the pain can get up to 7/10  OBJECTIVE:    DIAGNOSTIC FINDINGS:  na   PATIENT SURVEYS:  FOTO 49, predicted 69   SCREENING FOR RED FLAGS: Bowel or bladder incontinence: No Spinal tumors: No Cauda equina syndrome: No Compression fracture: No Abdominal aneurysm: No   COGNITION: Overall cognitive status: Within functional limits for tasks assessed                          SENSATION: WFL   MUSCLE LENGTH: Hamstrings: Right WNL ; Left WNL  Thomas test: Right neg ; Left neg  POSTURE: No Significant postural limitations     LUMBAR ROM:    AROM eval  Flexion Pain begins at fingertips just above knee but she is able to go to toes  Extension WNL  Right lateral flexion Fingertips just above joint line  Left lateral flexion Fingertips to joint line  Right rotation WNL  Left rotation WNL   (Blank rows = not tested)   LOWER EXTREMITY ROM:      WNL   LOWER EXTREMITY MMT:     5/5 throughout   LUMBAR SPECIAL TESTS:  Straight leg raise test: Negative     GAIT: Distance walked: 30 Assistive device utilized: None Level of assistance: Complete Independence Comments: antalgic   TODAY'S TREATMENT:  Date 03/31/2022:  Nustep x 5 min, level 5 Supine Piriformis stretch 3 x 20 sec each side Supine IT band/hip stretch 3 x 20 sec each side Isometric hip adduction x 10 hold 5 sec Isometric hip abduction x 10 hold 5 sec PPT with overhead pull downs (blue tband with handles) 2 x 10 (tband attached to recumbant bike) Supine clamshell x 20 with yellow loop Sidelying clamshell with yellow loop x 20 each side Quadruped cat/camel x 10 Quadruped hip extension with pink medium serious steel band x 10 each  Child's pose x 5 hold 10 seconds  Date 03/24/2022:  Nustep x 5 min, level 5 Foam rolling to right low back and hip x 2 min using white foam roller Supine Piriformis stretch 3 x 20 sec each side Supine IT band/hip  stretch 3 x 20 sec each side Isometric hip adduction x 10 hold 5 sec Isometric hip abduction x 10 hold 5 sec Supine clamshell x 20 with red loop Sidelying clamshell with red loop x 20 each side Quadruped fire hydrant x 20 bilaterally Quadruped childs pose with increasing hip abduction each rep x 5, hold 10 sec Ice pack to low back in hook lying x 8 min at end of treatment  Date 03/21/2022:  Nustep x 5 min, level 5 Foam rolling to right low back and hip x 2 min using white foam roller Supine Piriformis stretch 3 x 20 sec each side Supine IT band/hip stretch 3 x 20 sec each side Isometric hip adduction x 10 hold 5 sec Isometric hip abduction x 10 hold 5 sec Supine clamshell x 20 with red loop Sidelying clamshell with red loop x 20 each side Quadruped fire hydrant x 20 bilaterally Quadruped childs pose with increasing hip abduction each rep x 5, hold 10 sec Ice pack to low back in hook lying x 8 min at end of treatment    PATIENT EDUCATION:  Education details: Initiated HEP Person educated: Patient Education method: Programmer, multimedia, Facilities manager, Verbal cues, and Handouts Education comprehension: verbalized understanding, returned demonstration, and verbal cues required   HOME EXERCISE PROGRAM: Access Code: [email or web address] URL: [email or web address] Date: 03/01/2022 Prepared by: Mikey Kirschner  Exercises - Seated Piriformis Stretch with Trunk Bend  - 1 x daily - 7 x weekly - 1 sets - 3 reps - 30 hold - Supine Figure 4 Piriformis Stretch  - 1 x daily - 7 x weekly - 1 sets - 3 reps - 30 hold - Supine ITB Stretch with Strap  - 1 x daily - 7 x weekly - 1 sets - 3 reps - 30 hold - Supine Bridge  - 1 x daily - 7 x weekly - 2 sets - 10 reps - Clamshell  - 1 x daily - 7 x weekly -  2 sets - 10 reps - Supine Lower Trunk Rotation  - 1 x daily - 7 x weekly - 2 sets - 10 reps - Supine Posterior Pelvic Tilt  - 1 x daily - 7 x weekly - 2 sets - 10 reps - Standing Hamstring Stretch on  Chair  - 1 x daily - 7 x weekly - 1 sets - 3 reps - 30 hold - Standing Quad Stretch with Table and Chair Support  - 1 x daily - 7 x weekly - 1 sets - 3 reps - 30 hold - Side Lunge Adductor Stretch  - 1 x daily - 7 x weekly - 1 sets - 2 reps - 20-30 sec hold  ASSESSMENT:   CLINICAL IMPRESSION: Kelsey Barnett is progressing appropriately and meeting goals on time.   She was able to tolerate addition of resisted hip extension in quadruped.  Pt will continue to benefit from skilled PT to address continued deficits.     OBJECTIVE IMPAIRMENTS: difficulty walking, decreased ROM, decreased strength, increased muscle spasms, impaired flexibility, and pain.    ACTIVITY LIMITATIONS: lifting, bending, squatting, sleeping, transfers, continence, and dressing   PARTICIPATION LIMITATIONS: meal prep, cleaning, laundry, shopping, community activity, and occupation   PERSONAL FACTORS: Fitness, Profession, and 1 comorbidity: pregnancy  are also affecting patient's functional outcome.    REHAB POTENTIAL: Excellent   CLINICAL DECISION MAKING: Stable/uncomplicated   EVALUATION COMPLEXITY: Low     GOALS: Goals reviewed with patient? Yes   SHORT TERM GOALS: Target date: 03/22/2022     Patient will be independent with initial HEP  Baseline: Goal status: MET   2.  Pain report to be no greater than 4/10  Baseline:  Goal status: MET     LONG TERM GOALS: Target date: 04/19/2022     Patient to be independent with advanced HEP  Baseline:  Goal status: INITIAL   2.  Patient to report pain no greater than 2/10  Baseline:  Goal status: MET   3.  FOTO score to be 66 Baseline:  Goal status: INITIAL   4.  Patient to report 85% improvement in symptoms Baseline:  Goal status: INITIAL     PLAN:   PT FREQUENCY: 1-2x/week   PT DURATION: 8 weeks   PLANNED INTERVENTIONS: Therapeutic exercises, Therapeutic activity, Neuromuscular re-education, Balance training, Gait training, Patient/Family education, Self  Care, Joint mobilization, Aquatic Therapy, Dry Needling, Electrical stimulation, Spinal mobilization, Cryotherapy, Moist heat, Taping, Manual therapy, and Re-evaluation.   PLAN FOR NEXT SESSION: Nustep, progress right hip stability training and core stabilization, along with hip mobility and pelvic floor training.    Victorino Dike B. Otha Rickles, PT 03/31/22 8:44 AM  [facility] [address], [address] [place], [place] [address] Phone # [phone] Fax [phone]

## 2022-04-04 ENCOUNTER — Ambulatory Visit: Payer: BC Managed Care – PPO

## 2022-04-04 DIAGNOSIS — R252 Cramp and spasm: Secondary | ICD-10-CM

## 2022-04-04 DIAGNOSIS — R262 Difficulty in walking, not elsewhere classified: Secondary | ICD-10-CM

## 2022-04-04 DIAGNOSIS — M5459 Other low back pain: Secondary | ICD-10-CM

## 2022-04-04 DIAGNOSIS — M6281 Muscle weakness (generalized): Secondary | ICD-10-CM | POA: Diagnosis not present

## 2022-04-04 NOTE — Therapy (Signed)
OUTPATIENT PHYSICAL THERAPY TREATMENT NOTE   Patient Name: Kelsey Barnett MRN: [id number] DOB:02/21/1990, 32 y.o., female Today's Date: 04/04/2022  PCP: [facility]  REFERRING PROVIDER: Arby Barrette, FNP   END OF SESSION:   PT End of Session - 04/04/22 0814     Visit Number 10    Date for PT Re-Evaluation 04/19/22    Authorization Type BLUE CROSS BLUE [employer]IELD    PT Start Time 0804    PT Stop Time 0845    PT Time Calculation (min) 41 min    Activity Tolerance Patient tolerated treatment well    Behavior During Therapy [facility] for tasks assessed/performed              History reviewed. No pertinent past medical history. Past Surgical History:  Procedure Laterality Date   BREAST REDUCTION SURGERY Bilateral 11/05/2019   Procedure: BILATERAL BREAST REDUCTION WITH LIPOSUCTION;  Surgeon: Peggye Form, DO;  Location: [facility];  Service: Plastics;  Laterality: Bilateral;   WISDOM TOOTH EXTRACTION     Patient Active Problem List   Diagnosis Date Noted   S/P bilateral breast reduction 11/13/2019   Symptomatic mammary hypertrophy 06/18/2019   Back pain 06/18/2019   Neck pain 06/18/2019    REFERRING DIAG: O26.899 (ICD-10-CM) - Other specified pregnancy related conditions, unspecified trimester   THERAPY DIAG:  Other low back pain  Cramp and spasm  Muscle weakness (generalized)  Difficulty in walking, not elsewhere classified  Rationale for Evaluation and Treatment Rehabilitation  PERTINENT HISTORY: [redacted] weeks gestation   PRECAUTIONS: Other: pregnancy   SUBJECTIVE:                                                                                                                                                                               SUBJECTIVE STATEMENT:  Pain mostly with lying down and turning over in bed.  Pain is well controlled.  2/10.    PAIN:  Are you having pain? Yes: NPRS scale: 2/10 Pain location: right low back and hip Pain  description: aching Aggravating factors: start up, walking Relieving factors: ice, heat Patient states the pain can get up to 7/10  OBJECTIVE:    DIAGNOSTIC FINDINGS:  na   PATIENT SURVEYS:  FOTO 49, predicted 59   SCREENING FOR RED FLAGS: Bowel or bladder incontinence: No Spinal tumors: No Cauda equina syndrome: No Compression fracture: No Abdominal aneurysm: No   COGNITION: Overall cognitive status: Within functional limits for tasks assessed                          SENSATION: WFL   MUSCLE LENGTH: Hamstrings: Right WNL ; Left WNL  Thomas test: Right neg ; Left neg    POSTURE: No Significant postural limitations     LUMBAR ROM:    AROM eval  Flexion Pain begins at fingertips just above knee but she is able to go to toes  Extension WNL  Right lateral flexion Fingertips just above joint line  Left lateral flexion Fingertips to joint line  Right rotation WNL  Left rotation WNL   (Blank rows = not tested)   LOWER EXTREMITY ROM:      WNL   LOWER EXTREMITY MMT:     5/5 throughout   LUMBAR SPECIAL TESTS:  Straight leg raise test: Negative     GAIT: Distance walked: 30 Assistive device utilized: None Level of assistance: Complete Independence Comments: antalgic   TODAY'S TREATMENT:  Date 04/04/2022:  Nustep x 5 min, level 5 PPT x 20 Supine Piriformis stretch 3 x 20 sec each side Supine IT band/hip stretch 3 x 20 sec each side PPT with overhead pull downs (blue tband with handles) 2 x 10 (tband attached to recumbant bike) Supine clamshell x 20 with red loop Sidelying clamshell with red loop x 20 each side Quadruped alternating arm and leg x 20 Quadruped cat/camel x 10 Child's pose x 5 hold 10 seconds Ice to low back in supine hook lying x 10 min  Date 03/31/2022:  Nustep x 5 min, level 5 Supine Piriformis stretch 3 x 20 sec each side Supine IT band/hip stretch 3 x 20 sec each side Isometric hip adduction x 10 hold 5 sec Isometric hip abduction  x 10 hold 5 sec PPT with overhead pull downs (blue tband with handles) 2 x 10 (tband attached to recumbant bike) Supine clamshell x 20 with yellow loop Sidelying clamshell with yellow loop x 20 each side Quadruped cat/camel x 10 Quadruped hip extension with pink medium serious steel band x 10 each  Child's pose x 5 hold 10 seconds  Date 03/24/2022:  Nustep x 5 min, level 5 Foam rolling to right low back and hip x 2 min using white foam roller Supine Piriformis stretch 3 x 20 sec each side Supine IT band/hip stretch 3 x 20 sec each side Isometric hip adduction x 10 hold 5 sec Isometric hip abduction x 10 hold 5 sec Supine clamshell x 20 with red loop Sidelying clamshell with red loop x 20 each side Quadruped fire hydrant x 20 bilaterally Quadruped childs pose with increasing hip abduction each rep x 5, hold 10 sec Ice pack to low back in hook lying x 8 min at end of treatment  PATIENT EDUCATION:  Education details: Initiated HEP Person educated: Patient Education method: Programmer, multimedia, Facilities manager, Verbal cues, and Handouts Education comprehension: verbalized understanding, returned demonstration, and verbal cues required   HOME EXERCISE PROGRAM: Access Code: [email or web address] URL: [email or web address] Date: 03/01/2022 Prepared by: Mikey Kirschner  Exercises - Seated Piriformis Stretch with Trunk Bend  - 1 x daily - 7 x weekly - 1 sets - 3 reps - 30 hold - Supine Figure 4 Piriformis Stretch  - 1 x daily - 7 x weekly - 1 sets - 3 reps - 30 hold - Supine ITB Stretch with Strap  - 1 x daily - 7 x weekly - 1 sets - 3 reps - 30 hold - Supine Bridge  - 1 x daily - 7 x weekly - 2 sets - 10 reps - Clamshell  - 1 x daily - 7 x weekly - 2 sets - 10 reps - Supine Lower  Trunk Rotation  - 1 x daily - 7 x weekly - 2 sets - 10 reps - Supine Posterior Pelvic Tilt  - 1 x daily - 7 x weekly - 2 sets - 10 reps - Standing Hamstring Stretch on Chair  - 1 x daily - 7 x weekly - 1 sets - 3  reps - 30 hold - Standing Quad Stretch with Table and Chair Support  - 1 x daily - 7 x weekly - 1 sets - 3 reps - 30 hold - Side Lunge Adductor Stretch  - 1 x daily - 7 x weekly - 1 sets - 2 reps - 20-30 sec hold  ASSESSMENT:   CLINICAL IMPRESSION: Karsynn is approaching final goals.  She continues to have some discomfort with bed mobility and trying to get comfortable to sleep.   She was able to tolerate addition of resisted hip extension in quadruped.  Pt will continue to benefit from skilled PT to address continued deficits.     OBJECTIVE IMPAIRMENTS: difficulty walking, decreased ROM, decreased strength, increased muscle spasms, impaired flexibility, and pain.    ACTIVITY LIMITATIONS: lifting, bending, squatting, sleeping, transfers, continence, and dressing   PARTICIPATION LIMITATIONS: meal prep, cleaning, laundry, shopping, community activity, and occupation   PERSONAL FACTORS: Fitness, Profession, and 1 comorbidity: pregnancy  are also affecting patient's functional outcome.    REHAB POTENTIAL: Excellent   CLINICAL DECISION MAKING: Stable/uncomplicated   EVALUATION COMPLEXITY: Low     GOALS: Goals reviewed with patient? Yes   SHORT TERM GOALS: Target date: 03/22/2022     Patient will be independent with initial HEP  Baseline: Goal status: MET   2.  Pain report to be no greater than 4/10  Baseline:  Goal status: MET     LONG TERM GOALS: Target date: 04/19/2022     Patient to be independent with advanced HEP  Baseline:  Goal status: INITIAL   2.  Patient to report pain no greater than 2/10  Baseline:  Goal status: MET   3.  FOTO score to be 66 Baseline:  Goal status: INITIAL   4.  Patient to report 85% improvement in symptoms Baseline:  Goal status: INITIAL     PLAN:   PT FREQUENCY: 1-2x/week   PT DURATION: 8 weeks   PLANNED INTERVENTIONS: Therapeutic exercises, Therapeutic activity, Neuromuscular re-education, Balance training, Gait training,  Patient/Family education, Self Care, Joint mobilization, Aquatic Therapy, Dry Needling, Electrical stimulation, Spinal mobilization, Cryotherapy, Moist heat, Taping, Manual therapy, and Re-evaluation.   PLAN FOR NEXT SESSION: Nustep, progress right hip stability training and core stabilization, along with hip mobility and pelvic floor training.    Victorino Dike B. Tarae Wooden, PT 04/04/22 8:44 AM  [facility] [address], [address] [place], [place] [address] Phone # [phone] Fax [phone]

## 2022-04-11 ENCOUNTER — Ambulatory Visit: Payer: BC Managed Care – PPO

## 2022-04-11 DIAGNOSIS — M5459 Other low back pain: Secondary | ICD-10-CM | POA: Diagnosis not present

## 2022-04-11 DIAGNOSIS — M6281 Muscle weakness (generalized): Secondary | ICD-10-CM | POA: Diagnosis not present

## 2022-04-11 DIAGNOSIS — R252 Cramp and spasm: Secondary | ICD-10-CM

## 2022-04-11 DIAGNOSIS — R262 Difficulty in walking, not elsewhere classified: Secondary | ICD-10-CM | POA: Diagnosis not present

## 2022-04-11 NOTE — Therapy (Signed)
OUTPATIENT PHYSICAL THERAPY TREATMENT NOTE PHYSICAL THERAPY DISCHARGE SUMMARY  Visits from Start of Care: 11  Current functional level related to goals / functional outcomes: See below   Remaining deficits: See below   Education / Equipment: See below   Patient agrees to discharge. Patient goals were met. Patient is being discharged due to meeting the stated rehab goals.    Patient Name: Kelsey Barnett MRN: [id number] DOB:02-25-1990, 32 y.o., female Today's Date: 04/11/2022  PCP: [facility]  REFERRING PROVIDER: Arby Barrette, FNP   END OF SESSION:   PT End of Session - 04/11/22 0804     Visit Number 11    Date for PT Re-Evaluation 04/19/22    Authorization Type BLUE CROSS BLUE SHIELD    PT Start Time 0800    PT Stop Time 0845    PT Time Calculation (min) 45 min    Activity Tolerance Patient tolerated treatment well    Behavior During Therapy [facility] for tasks assessed/performed              History reviewed. No pertinent past medical history. Past Surgical History:  Procedure Laterality Date   BREAST REDUCTION SURGERY Bilateral 11/05/2019   Procedure: BILATERAL BREAST REDUCTION WITH LIPOSUCTION;  Surgeon: Peggye Form, DO;  Location: [facility];  Service: Plastics;  Laterality: Bilateral;   WISDOM TOOTH EXTRACTION     Patient Active Problem List   Diagnosis Date Noted   S/P bilateral breast reduction 11/13/2019   Symptomatic mammary hypertrophy 06/18/2019   Back pain 06/18/2019   Neck pain 06/18/2019    REFERRING DIAG: O26.899 (ICD-10-CM) - Other specified pregnancy related conditions, unspecified trimester   THERAPY DIAG:  Other low back pain  Cramp and spasm  Muscle weakness (generalized)  Difficulty in walking, not elsewhere classified  Rationale for Evaluation and Treatment Rehabilitation  PERTINENT HISTORY: [redacted] weeks gestation   PRECAUTIONS: Other: pregnancy   SUBJECTIVE:                                                                                                                                                                                SUBJECTIVE STATEMENT:  Deletha reports she is about 80% improved from initial appt.  She is ready for DC.    PAIN:  Are you having pain? Yes: NPRS scale: 2/10 Pain location: right low back and hip Pain description: aching Aggravating factors: start up, walking Relieving factors: ice, heat Patient states the pain can get up to 6/10  OBJECTIVE:    DIAGNOSTIC FINDINGS:  na   PATIENT SURVEYS:  FOTO 49, predicted 66 04/11/22: 72   SCREENING FOR RED FLAGS: Bowel or bladder incontinence: No Spinal tumors: No Cauda  equina syndrome: No Compression fracture: No Abdominal aneurysm: No   COGNITION: Overall cognitive status: Within functional limits for tasks assessed                          SENSATION: WFL   MUSCLE LENGTH: Hamstrings: Right WNL ; Left WNL  Thomas test: Right neg ; Left neg    POSTURE: No Significant postural limitations     LUMBAR ROM:    AROM eval 04/11/22  Flexion Pain begins at fingertips just above knee but she is able to go to toes Fingertips to distal shin  Extension WNL WNL  Right lateral flexion Fingertips just above joint line Fingertips to joint line  Left lateral flexion Fingertips to joint line Fingertips to joint line  Right rotation WNL WNL  Left rotation WNL WNL   (Blank rows = not tested)   LOWER EXTREMITY ROM:      WNL   LOWER EXTREMITY MMT:     5/5 throughout   LUMBAR SPECIAL TESTS:  Straight leg raise test: Negative     GAIT: Distance walked: 30 Assistive device utilized: None Level of assistance: Complete Independence Comments: antalgic   TODAY'S TREATMENT:  Date 04/11/2022:  Nustep x 5 min, level 5 Reviewed HEP and how to progress for DC Seated Piriformis stretch 3 x 20 sec each side PPT with overhead pull downs (blue tband with handles) 2 x 10 (tband attached to recumbant bike) Instructed in  several core exercises she could do with overhead pull down tband position.  Supine clamshell x 20 with red loop Sidelying clamshell with red loop x 20 each side Quadruped alternating arm and leg x 20 Quadruped cat/camel x 10 Child's pose x 5 hold 10 seconds Ice to low back in supine hook lying x 10 min  Date 04/04/2022:  Nustep x 5 min, level 5 PPT x 20 Supine Piriformis stretch 3 x 20 sec each side Supine IT band/hip stretch 3 x 20 sec each side PPT with overhead pull downs (blue tband with handles) 2 x 10 (tband attached to recumbant bike) Supine clamshell x 20 with red loop Sidelying clamshell with red loop x 20 each side Quadruped alternating arm and leg x 20 Quadruped cat/camel x 10 Child's pose x 5 hold 10 seconds Ice to low back in supine hook lying x 10 min  Date 03/31/2022:  Nustep x 5 min, level 5 Supine Piriformis stretch 3 x 20 sec each side Supine IT band/hip stretch 3 x 20 sec each side Isometric hip adduction x 10 hold 5 sec Isometric hip abduction x 10 hold 5 sec PPT with overhead pull downs (blue tband with handles) 2 x 10 (tband attached to recumbant bike) Supine clamshell x 20 with yellow loop Sidelying clamshell with yellow loop x 20 each side Quadruped cat/camel x 10 Quadruped hip extension with pink medium serious steel band x 10 each  Child's pose x 5 hold 10 seconds   PATIENT EDUCATION:  Education details: Initiated HEP Person educated: Patient Education method: Programmer, multimedia, Facilities manager, Verbal cues, and Handouts Education comprehension: verbalized understanding, returned demonstration, and verbal cues required   HOME EXERCISE PROGRAM: Access Code: [email or web address] URL: [email or web address] Date: 04/11/2022 Prepared by: Mikey Kirschner  Exercises - Seated Piriformis Stretch with Trunk Bend  - 1 x daily - 7 x weekly - 1 sets - 3 reps - 30 hold - Supine Figure 4 Piriformis Stretch  - 1 x daily - 7 x weekly - 1  sets - 3 reps - 30 hold -  Supine ITB Stretch with Strap  - 1 x daily - 7 x weekly - 1 sets - 3 reps - 30 hold - Supine Bridge  - 1 x daily - 7 x weekly - 2 sets - 10 reps - Clamshell  - 1 x daily - 7 x weekly - 2 sets - 10 reps - Supine Lower Trunk Rotation  - 1 x daily - 7 x weekly - 2 sets - 10 reps - Supine Posterior Pelvic Tilt  - 1 x daily - 7 x weekly - 2 sets - 10 reps - Standing Hamstring Stretch on Chair  - 1 x daily - 7 x weekly - 1 sets - 3 reps - 30 hold - Standing Quad Stretch with Table and Chair Support  - 1 x daily - 7 x weekly - 1 sets - 3 reps - 30 hold - Side Lunge Adductor Stretch  - 1 x daily - 7 x weekly - 1 sets - 2 reps - 20-30 sec hold - Supine Arm [place] in Table Top - Pull Down  - 1 x daily - 7 x weekly - 1 sets - 10 reps - 5 sec hold - Supine Hip Adduction Isometric with Ball  - 1 x daily - 7 x weekly - 1 sets - 10 reps - 5 sec hold - Hooklying Isometric Clamshell  - 1 x daily - 7 x weekly - 1 sets - 10 reps - 5 sec hold  ASSESSMENT:   CLINICAL IMPRESSION: Bethanne is now [redacted] weeks gestation.  She is doing well and pain is well controlled.  She is compliant and motivated and has met all goals.  She continues to have some mild discomfort with bed mobility and trying to get comfortable to sleep. Overall, she is doing very well and is ready for DC.  She should continue to do well.        OBJECTIVE IMPAIRMENTS: difficulty walking, decreased ROM, decreased strength, increased muscle spasms, impaired flexibility, and pain.    ACTIVITY LIMITATIONS: lifting, bending, squatting, sleeping, transfers, continence, and dressing   PARTICIPATION LIMITATIONS: meal prep, cleaning, laundry, shopping, community activity, and occupation   PERSONAL FACTORS: Fitness, Profession, and 1 comorbidity: pregnancy  are also affecting patient's functional outcome.    REHAB POTENTIAL: Excellent   CLINICAL DECISION MAKING: Stable/uncomplicated   EVALUATION COMPLEXITY: Low     GOALS: Goals reviewed with patient?  Yes   SHORT TERM GOALS: Target date: 03/22/2022     Patient will be independent with initial HEP  Baseline: Goal status: MET   2.  Pain report to be no greater than 4/10  Baseline:  Goal status: MET     LONG TERM GOALS: Target date: 04/19/2022     Patient to be independent with advanced HEP  Baseline:  Goal status: MET   2.  Patient to report pain no greater than 2/10  Baseline:  Goal status: MET   3.  FOTO score to be 66 Baseline:  Goal status: MET   4.  Patient to report 85% improvement in symptoms Baseline:  Goal status: MET     PLAN:   PT FREQUENCY: 1-2x/week   PT DURATION: 8 weeks   PLANNED INTERVENTIONS: Therapeutic exercises, Therapeutic activity, Neuromuscular re-education, Balance training, Gait training, Patient/Family education, Self Care, Joint mobilization, Aquatic Therapy, Dry Needling, Electrical stimulation, Spinal mobilization, Cryotherapy, Moist heat, Taping, Manual therapy, and Re-evaluation.   PLAN FOR NEXT SESSION:  We will  DC at this time.     Victorino Dike B. Ethon Wymer, PT 04/11/22 8:49 AM  [facility] [address], [address] [place], [place] [address] Phone # [phone] Fax [phone]

## 2022-04-14 ENCOUNTER — Encounter: Payer: BC Managed Care – PPO | Admitting: Physical Therapy

## 2022-04-24 DIAGNOSIS — Z3685 Encounter for antenatal screening for Streptococcus B: Secondary | ICD-10-CM | POA: Diagnosis not present

## 2022-04-24 LAB — OB RESULTS CONSOLE GBS: GBS: NEGATIVE

## 2022-04-26 DIAGNOSIS — R030 Elevated blood-pressure reading, without diagnosis of hypertension: Secondary | ICD-10-CM | POA: Diagnosis not present

## 2022-04-26 DIAGNOSIS — Z3A36 36 weeks gestation of pregnancy: Secondary | ICD-10-CM | POA: Diagnosis not present

## 2022-05-22 ENCOUNTER — Encounter (HOSPITAL_COMMUNITY): Payer: Self-pay | Admitting: *Deleted

## 2022-05-22 ENCOUNTER — Telehealth (HOSPITAL_COMMUNITY): Payer: Self-pay | Admitting: *Deleted

## 2022-05-22 NOTE — Telephone Encounter (Signed)
Preadmission screen  

## 2022-05-23 ENCOUNTER — Inpatient Hospital Stay (HOSPITAL_COMMUNITY)
Admission: AD | Admit: 2022-05-23 | Discharge: 2022-05-23 | Disposition: A | Discharge status: Home / Self Care | Payer: BC Managed Care – PPO | Source: Home / Self Care | Attending: Obstetrics and Gynecology | Admitting: Obstetrics and Gynecology

## 2022-05-23 ENCOUNTER — Telehealth (HOSPITAL_COMMUNITY): Payer: Self-pay | Admitting: *Deleted

## 2022-05-23 ENCOUNTER — Encounter (HOSPITAL_COMMUNITY): Payer: Self-pay | Admitting: Obstetrics and Gynecology

## 2022-05-23 DIAGNOSIS — O26893 Other specified pregnancy related conditions, third trimester: Secondary | ICD-10-CM | POA: Insufficient documentation

## 2022-05-23 DIAGNOSIS — Z3A40 40 weeks gestation of pregnancy: Secondary | ICD-10-CM

## 2022-05-23 DIAGNOSIS — O4292 Full-term premature rupture of membranes, unspecified as to length of time between rupture and onset of labor: Secondary | ICD-10-CM | POA: Diagnosis not present

## 2022-05-23 DIAGNOSIS — O411230 Chorioamnionitis, third trimester, not applicable or unspecified: Secondary | ICD-10-CM | POA: Diagnosis not present

## 2022-05-23 DIAGNOSIS — R030 Elevated blood-pressure reading, without diagnosis of hypertension: Secondary | ICD-10-CM | POA: Insufficient documentation

## 2022-05-23 DIAGNOSIS — O411430 Placentitis, third trimester, not applicable or unspecified: Secondary | ICD-10-CM | POA: Diagnosis not present

## 2022-05-23 DIAGNOSIS — J029 Acute pharyngitis, unspecified: Secondary | ICD-10-CM | POA: Insufficient documentation

## 2022-05-23 DIAGNOSIS — O134 Gestational [pregnancy-induced] hypertension without significant proteinuria, complicating childbirth: Secondary | ICD-10-CM | POA: Diagnosis not present

## 2022-05-23 DIAGNOSIS — O480 Post-term pregnancy: Secondary | ICD-10-CM | POA: Diagnosis not present

## 2022-05-23 DIAGNOSIS — O43893 Other placental disorders, third trimester: Secondary | ICD-10-CM | POA: Diagnosis not present

## 2022-05-23 DIAGNOSIS — O164 Unspecified maternal hypertension, complicating childbirth: Secondary | ICD-10-CM | POA: Diagnosis not present

## 2022-05-23 LAB — COMPREHENSIVE METABOLIC PANEL
ALT: 15 U/L (ref 0–44)
AST: 25 U/L (ref 15–41)
Albumin: 2.8 g/dL — ABNORMAL LOW (ref 3.5–5.0)
Alkaline Phosphatase: 218 U/L — ABNORMAL HIGH (ref 38–126)
Anion gap: 10 (ref 5–15)
BUN: 5 mg/dL — ABNORMAL LOW (ref 6–20)
CO2: 19 mmol/L — ABNORMAL LOW (ref 22–32)
Calcium: 8.6 mg/dL — ABNORMAL LOW (ref 8.9–10.3)
Chloride: 107 mmol/L (ref 98–111)
Creatinine, Ser: 0.66 mg/dL (ref 0.44–1.00)
GFR, Estimated: >60 mL/min (ref >60)
Glucose, Bld: 115 mg/dL — ABNORMAL HIGH (ref 70–99)
Potassium: 3.7 mmol/L (ref 3.5–5.1)
Sodium: 136 mmol/L (ref 135–145)
Total Bilirubin: 0.4 mg/dL (ref 0.3–1.2)
Total Protein: 6.3 g/dL — ABNORMAL LOW (ref 6.5–8.1)

## 2022-05-23 LAB — CBC WITH DIFFERENTIAL/PLATELET
Abs Immature Granulocytes: 0.19 10*3/uL — ABNORMAL HIGH (ref 0.00–0.07)
Basophils Absolute: 0.1 10*3/uL (ref 0.0–0.1)
Basophils Relative: 1 %
Eosinophils Absolute: 0.2 10*3/uL (ref 0.0–0.5)
Eosinophils Relative: 2 %
HCT: 37.6 % (ref 36.0–46.0)
Hemoglobin: 12.5 g/dL (ref 12.0–15.0)
Immature Granulocytes: 2 %
Lymphocytes Relative: 17 %
Lymphs Abs: 2 10*3/uL (ref 0.7–4.0)
MCH: 27.8 pg (ref 26.0–34.0)
MCHC: 33.2 g/dL (ref 30.0–36.0)
MCV: 83.7 fL (ref 80.0–100.0)
Monocytes Absolute: 0.9 10*3/uL (ref 0.1–1.0)
Monocytes Relative: 7 %
Neutro Abs: 8.4 10*3/uL — ABNORMAL HIGH (ref 1.7–7.7)
Neutrophils Relative %: 71 %
Platelets: 165 10*3/uL (ref 150–400)
RBC: 4.49 MIL/uL (ref 3.87–5.11)
RDW: 15.6 % — ABNORMAL HIGH (ref 11.5–15.5)
WBC: 11.7 10*3/uL — ABNORMAL HIGH (ref 4.0–10.5)
nRBC: 0 % (ref 0.0–0.2)

## 2022-05-23 LAB — GROUP A STREP BY PCR: Group A Strep by PCR: NOT DETECTED

## 2022-05-23 LAB — PROTEIN / CREATININE RATIO, URINE
Creatinine, Urine: 94 mg/dL
Protein Creatinine Ratio: 0.17 mg/mg{Cre} — ABNORMAL HIGH (ref 0.00–0.15)
Total Protein, Urine: 16 mg/dL

## 2022-05-23 NOTE — Telephone Encounter (Signed)
Preadmission screen  

## 2022-05-23 NOTE — MAU Provider Note (Signed)
History     CSN: [id number]  Arrival date and time: 05/23/22 1321   Event Date/Time   First Provider Initiated Contact with Patient 05/23/22 1434      Chief Complaint  Patient presents with   Hypertension   HPI  [name] is a 32 y.o. female G1P0 @ [redacted]w[redacted]d here in MAU with complaints of elevated BP. She was seen in the office today and sent over for further evaluation. She reports home elevated BP @ 36 weeks and was seen in the office that day and her BP was normal.   She was in the office today and had two elevated BP's 152/98, 146/100. She has no scotoma. Mild headache 2/10. Has not tried anything for the pain.   OB History     Gravida  1   Para      Term      Preterm      AB      Living         SAB      IAB      Ectopic      Multiple      Live Births              History reviewed. No pertinent past medical history.  Past Surgical History:  Procedure Laterality Date   BREAST REDUCTION SURGERY Bilateral 11/05/2019   Procedure: BILATERAL BREAST REDUCTION WITH LIPOSUCTION;  Surgeon: Peggye Form, DO;  Location: [facility];  Service: Plastics;  Laterality: Bilateral;   WISDOM TOOTH EXTRACTION      Family History  Problem Relation Age of Onset   Alzheimer's disease Mother     Social History   Tobacco Use   Smoking status: Never   Smokeless tobacco: Never  Vaping Use   Vaping Use: Never used  Substance Use Topics   Alcohol use: Not Currently    Comment: occassionally   Drug use: Never    Allergies: No Known Allergies  Medications Prior to Admission  Medication Sig Dispense Refill Last Dose   acetaminophen (TYLENOL) 500 MG tablet Take 1 tablet (500 mg total) by mouth every 6 (six) hours as needed. For use AFTER surgery 30 tablet 0 05/23/2022   Melatonin 1 MG CHEW Chew 5 mg by mouth.   Past Month   Prenatal Vit-Fe Fumarate-FA (PRENATAL MULTIVITAMIN) TABS tablet Take 1 tablet by mouth daily at 12 noon.   05/23/2022    ibuprofen (ADVIL) 600 MG tablet Take 1 tablet (600 mg total) by mouth every 6 (six) hours as needed for mild pain or moderate pain. For use AFTER surgery (Patient not taking: Reported on 05/23/2022) 30 tablet 0 Not Taking   Results for orders placed or performed during the hospital encounter of 05/23/22 (from the past 48 hour(s))  CBC with Differential/Platelet     Status: Abnormal   Collection Time: 05/23/22  2:18 PM  Result Value Ref Range   WBC 11.7 (H) 4.0 - 10.5 K/uL   RBC 4.49 3.87 - 5.11 MIL/uL   Hemoglobin 12.5 12.0 - 15.0 g/dL   HCT 81.8 40.3 - 75.4 %   MCV 83.7 80.0 - 100.0 fL   MCH 27.8 26.0 - 34.0 pg   MCHC 33.2 30.0 - 36.0 g/dL   RDW 36.0 (H) 67.7 - 03.4 %   Platelets 165 150 - 400 K/uL   nRBC 0.0 0.0 - 0.2 %   Neutrophils Relative % 71 %   Neutro Abs 8.4 (H) 1.7 - 7.7  K/uL   Lymphocytes Relative 17 %   Lymphs Abs 2.0 0.7 - 4.0 K/uL   Monocytes Relative 7 %   Monocytes Absolute 0.9 0.1 - 1.0 K/uL   Eosinophils Relative 2 %   Eosinophils Absolute 0.2 0.0 - 0.5 K/uL   Basophils Relative 1 %   Basophils Absolute 0.1 0.0 - 0.1 K/uL   Immature Granulocytes 2 %   Abs Immature Granulocytes 0.19 (H) 0.00 - 0.07 K/uL    Comment: Performed at [facility], [address] [place], [place] [address]  Comprehensive metabolic panel     Status: Abnormal   Collection Time: 05/23/22  2:18 PM  Result Value Ref Range   Sodium 136 135 - 145 mmol/L   Potassium 3.7 3.5 - 5.1 mmol/L   Chloride 107 98 - 111 mmol/L   CO2 19 (L) 22 - 32 mmol/L   Glucose, Bld 115 (H) 70 - 99 mg/dL    Comment: Glucose reference range applies only to samples taken after fasting for at least 8 hours.   BUN 5 (L) 6 - 20 mg/dL   Creatinine, Ser 6.040.66 0.44 - 1.00 mg/dL   Calcium 8.6 (L) 8.9 - 10.3 mg/dL   Total Protein 6.3 (L) 6.5 - 8.1 g/dL   Albumin 2.8 (L) 3.5 - 5.0 g/dL   AST 25 15 - 41 U/L   ALT 15 0 - 44 U/L   Alkaline Phosphatase 218 (H) 38 - 126 U/L   Total Bilirubin 0.4 0.3 - 1.2 mg/dL   GFR,  Estimated >54>60 >09>60 mL/min    Comment: (NOTE) Calculated using the CKD-EPI Creatinine Equation (2021)    Anion gap 10 5 - 15    Comment: Performed at [facility], [address] [place], [place] [address]  Protein / creatinine ratio, urine     Status: Abnormal   Collection Time: 05/23/22  2:46 PM  Result Value Ref Range   Creatinine, Urine 94 mg/dL   Total Protein, Urine 16 mg/dL    Comment: NO NORMAL RANGE ESTABLISHED FOR THIS TEST   Protein Creatinine Ratio 0.17 (H) 0.00 - 0.15 mg/mg[Cre]    Comment: Performed at [facility], [address] [place], [place] [address]     Review of Systems  Eyes:  Negative for photophobia and visual disturbance.  Gastrointestinal:  Negative for abdominal pain.  Neurological:  Positive for headaches.   Physical Exam   Blood pressure 120/87, pulse (!) 106, temperature 98.4 F (36.9 C), temperature source Oral, resp. rate 15, height 5' 3.5" (1.613 m), weight 71.1 kg, SpO2 99 %.  Patient Vitals for the past 24 hrs:  BP Temp Temp src Pulse Resp SpO2 Height Weight  05/23/22 1620 131/87 -- -- 99 -- -- -- --  05/23/22 1601 118/82 -- -- (!) 104 -- -- -- --  05/23/22 1546 120/87 -- -- (!) 106 -- -- -- --  05/23/22 1531 120/81 -- -- 100 -- -- -- --  05/23/22 1516 122/86 -- -- (!) 105 -- -- -- --  05/23/22 1501 119/85 -- -- (!) 106 -- -- -- --  05/23/22 1446 127/87 -- -- 95 -- -- -- --  05/23/22 1431 119/84 -- -- (!) 102 -- -- -- --  05/23/22 1416 111/84 -- -- (!) 103 -- -- -- --  05/23/22 1415 121/86 -- -- (!) 105 -- -- -- --  05/23/22 1353 132/87 -- -- 96 -- -- -- --  05/23/22 1343 125/87 98.4 F (36.9  C) Oral (!) 103 15 99 % 5' 3.5" (1.613 m) 71.1 kg     Physical Exam Vitals and nursing note reviewed.  Constitutional:      Appearance: Normal appearance.  Skin:    General: Skin is warm.  Neurological:     Mental Status: She is alert and oriented to person, place, and time.  Psychiatric:        Behavior: Behavior normal.    NST: baseline 150 bpm, 15x15 accels, no decels. Uterine irritability.   MAU Course  Procedures  MDM  BP's grossly normal in MAU PIH labs without acute findings. Reviewed patient with Dr. Donavan Foil. Given her gestational age; [redacted]w[redacted]d and 2 elevated BP's it is recommended that she stay. Will discuss with Dr. Senaida Ores. Dr. Senaida Ores down to MAU to discuss plan of care with the patient.  Care resumed by Dr. Senaida Ores. At the time of discharge, the patient requests throat swab for new onset sore throat.   Assessment and Plan   Reshad Saab, Harolyn Rutherford, NP

## 2022-05-23 NOTE — MAU Note (Signed)
...  Kelsey Barnett is a 32 y.o. at [redacted]w[redacted]d here in MAU reporting: Sent over from the office for blood pressure eval. She endorses a current HA that began this morning prior to her OB visit that has worsened since then. Denies RUQ/epigastric pain, edema, and visual disturbances. +FM. Denies VB. She reports yesterday she experienced a few instances where she felt tiny trickles but is unsure if it was discharge. Has not felt this today.  Pain score: 2/10 HA  FHT: 170 initial external

## 2022-05-23 NOTE — Progress Notes (Signed)
Pt in MAU with serial BP's for almost 3 hours and all WNL 111-132/81-87  All PIH labs WNL prot:creat 0.17  FHR reactive  Pt having no symptoms currently except sore throat and requesting throat swab for strep.    D/w pt option of admission and IOL vs close f/u in office and she prefers close f/u in 2 days for repeat BP check and has planned IOL within a week with TM if no spontaneous labor. Preeclamptic precautions reviewed and patient aware to call with escalating HA or decreased FM.

## 2022-05-24 ENCOUNTER — Telehealth (HOSPITAL_COMMUNITY): Payer: Self-pay | Admitting: *Deleted

## 2022-05-24 ENCOUNTER — Encounter (HOSPITAL_COMMUNITY): Payer: Self-pay | Admitting: *Deleted

## 2022-05-24 NOTE — Telephone Encounter (Signed)
Preadmission screen  

## 2022-05-26 ENCOUNTER — Other Ambulatory Visit: Payer: Self-pay

## 2022-05-26 ENCOUNTER — Inpatient Hospital Stay (HOSPITAL_COMMUNITY): Payer: BC Managed Care – PPO | Admitting: Anesthesiology

## 2022-05-26 ENCOUNTER — Inpatient Hospital Stay (HOSPITAL_COMMUNITY)
Admission: AD | Admit: 2022-05-26 | Discharge: 2022-05-29 | DRG: 786 | Disposition: A | Discharge status: Home / Self Care | Payer: BC Managed Care – PPO | Attending: Obstetrics and Gynecology | Admitting: Obstetrics and Gynecology

## 2022-05-26 ENCOUNTER — Encounter (HOSPITAL_COMMUNITY): Payer: Self-pay | Admitting: Obstetrics

## 2022-05-26 DIAGNOSIS — O4292 Full-term premature rupture of membranes, unspecified as to length of time between rupture and onset of labor: Principal | ICD-10-CM | POA: Diagnosis present

## 2022-05-26 DIAGNOSIS — O480 Post-term pregnancy: Secondary | ICD-10-CM | POA: Diagnosis present

## 2022-05-26 DIAGNOSIS — Z3A40 40 weeks gestation of pregnancy: Secondary | ICD-10-CM

## 2022-05-26 DIAGNOSIS — O411230 Chorioamnionitis, third trimester, not applicable or unspecified: Secondary | ICD-10-CM | POA: Diagnosis present

## 2022-05-26 DIAGNOSIS — O4290 Premature rupture of membranes, unspecified as to length of time between rupture and onset of labor, unspecified weeks of gestation: Principal | ICD-10-CM | POA: Diagnosis present

## 2022-05-26 LAB — CBC
HCT: 38.4 % (ref 36.0–46.0)
Hemoglobin: 13.2 g/dL (ref 12.0–15.0)
MCH: 28.6 pg (ref 26.0–34.0)
MCHC: 34.4 g/dL (ref 30.0–36.0)
MCV: 83.1 fL (ref 80.0–100.0)
Platelets: 163 10*3/uL (ref 150–400)
RBC: 4.62 MIL/uL (ref 3.87–5.11)
RDW: 15.9 % — ABNORMAL HIGH (ref 11.5–15.5)
WBC: 17.9 10*3/uL — ABNORMAL HIGH (ref 4.0–10.5)
nRBC: 0 % (ref 0.0–0.2)

## 2022-05-26 LAB — CULTURE, GROUP A STREP (THRC)

## 2022-05-26 LAB — TYPE AND SCREEN
ABO/RH(D): A POS
Antibody Screen: NEGATIVE

## 2022-05-26 LAB — RPR: RPR Ser Ql: NONREACTIVE

## 2022-05-26 LAB — POCT FERN TEST: POCT Fern Test: POSITIVE

## 2022-05-26 MED ORDER — LACTATED RINGERS IV SOLN
500.0000 mL | Freq: Once | INTRAVENOUS | Status: AC
  Administered 2022-05-26: 500 mL via INTRAVENOUS

## 2022-05-26 MED ORDER — DIPHENHYDRAMINE HCL 50 MG/ML IJ SOLN: 12.5000 mg | INTRAMUSCULAR | Status: DC | PRN

## 2022-05-26 MED ORDER — EPHEDRINE 5 MG/ML INJ: 10.0000 mg | INTRAVENOUS | Status: DC | PRN

## 2022-05-26 MED ORDER — OXYCODONE-ACETAMINOPHEN 5-325 MG PO TABS: 2.0000 | ORAL_TABLET | ORAL | Status: DC | PRN

## 2022-05-26 MED ORDER — FENTANYL CITRATE (PF) 100 MCG/2ML IJ SOLN
50.0000 ug | INTRAMUSCULAR | Status: DC | PRN
  Administered 2022-05-26 (×3): 100 ug via INTRAVENOUS
  Filled 2022-05-26 (×3): qty 2

## 2022-05-26 MED ORDER — PHENYLEPHRINE 80 MCG/ML (10ML) SYRINGE FOR IV PUSH (FOR BLOOD PRESSURE SUPPORT)
80.0000 ug | PREFILLED_SYRINGE | INTRAVENOUS | Status: DC | PRN
  Administered 2022-05-26 (×2): 80 ug via INTRAVENOUS

## 2022-05-26 MED ORDER — PHENYLEPHRINE 80 MCG/ML (10ML) SYRINGE FOR IV PUSH (FOR BLOOD PRESSURE SUPPORT)
80.0000 ug | PREFILLED_SYRINGE | INTRAVENOUS | Status: DC | PRN
  Filled 2022-05-26: qty 10

## 2022-05-26 MED ORDER — OXYTOCIN-SODIUM CHLORIDE 30-0.9 UT/500ML-% IV SOLN
1.0000 m[IU]/min | INTRAVENOUS | Status: DC
  Administered 2022-05-26: 2 m[IU]/min via INTRAVENOUS

## 2022-05-26 MED ORDER — LIDOCAINE HCL (PF) 1 % IJ SOLN
INTRAMUSCULAR | Status: DC | PRN
  Administered 2022-05-26: 5 mL via EPIDURAL
  Administered 2022-05-26: 3 mL via EPIDURAL

## 2022-05-26 MED ORDER — ONDANSETRON HCL 4 MG/2ML IJ SOLN: 4.0000 mg | Freq: Four times a day (QID) | INTRAMUSCULAR | Status: DC | PRN

## 2022-05-26 MED ORDER — OXYCODONE-ACETAMINOPHEN 5-325 MG PO TABS: 1.0000 | ORAL_TABLET | ORAL | Status: DC | PRN

## 2022-05-26 MED ORDER — FENTANYL-BUPIVACAINE-NACL 0.5-0.125-0.9 MG/250ML-% EP SOLN
12.0000 mL/h | EPIDURAL | Status: DC | PRN
  Administered 2022-05-26: 12 mL/h via EPIDURAL
  Filled 2022-05-26: qty 250

## 2022-05-26 MED ORDER — ACETAMINOPHEN 325 MG PO TABS: 650.0000 mg | ORAL_TABLET | ORAL | Status: DC | PRN

## 2022-05-26 MED ORDER — EPHEDRINE 5 MG/ML INJ
10.0000 mg | INTRAVENOUS | Status: DC | PRN
  Filled 2022-05-26: qty 5

## 2022-05-26 MED ORDER — LACTATED RINGERS IV SOLN
500.0000 mL | INTRAVENOUS | Status: DC | PRN
  Administered 2022-05-26: 1000 mL via INTRAVENOUS

## 2022-05-26 MED ORDER — TERBUTALINE SULFATE 1 MG/ML IJ SOLN: 0.2500 mg | Freq: Once | INTRAMUSCULAR | Status: DC | PRN

## 2022-05-26 MED ORDER — LIDOCAINE HCL (PF) 1 % IJ SOLN: 30.0000 mL | INTRAMUSCULAR | Status: DC | PRN

## 2022-05-26 MED ORDER — OXYTOCIN-SODIUM CHLORIDE 30-0.9 UT/500ML-% IV SOLN
2.5000 [IU]/h | INTRAVENOUS | Status: DC
  Filled 2022-05-26: qty 500

## 2022-05-26 MED ORDER — TERBUTALINE SULFATE 1 MG/ML IJ SOLN
0.2500 mg | Freq: Once | INTRAMUSCULAR | Status: DC
  Filled 2022-05-26: qty 1

## 2022-05-26 MED ORDER — LACTATED RINGERS IV SOLN: INTRAVENOUS | Status: DC

## 2022-05-26 MED ORDER — SOD CITRATE-CITRIC ACID 500-334 MG/5ML PO SOLN
30.0000 mL | ORAL | Status: DC | PRN
  Filled 2022-05-26: qty 30

## 2022-05-26 MED ORDER — OXYTOCIN BOLUS FROM INFUSION: 333.0000 mL | Freq: Once | INTRAVENOUS | Status: DC

## 2022-05-26 NOTE — Progress Notes (Signed)
CE 4-5/80/-2, pitocin at 48mU/min, TOCO q40m, IUPC placed, MVUs inadequate at 120, continue to titrate. Bloody show on exam, pt comfortable  BP 110/75   Pulse (!) 126   Temp 98.5 F (36.9 C) (Oral)   Resp 16   Ht 5\' 4"  (1.626 m)   Wt 71.7 kg   SpO2 100%   BMI 27.12 kg/m

## 2022-05-26 NOTE — Progress Notes (Signed)
Dr Chestine Spore at bedside and FM strip reviewed. Pt got hot, nauseated, and pale as IV started. Pt turned side to side and then all 4s. FHR stayed 80s-90s-100s. Pt back to L side and FHR back to 150s. Total time of prolonged decel was from 0332 to 0340

## 2022-05-26 NOTE — Anesthesia Preprocedure Evaluation (Signed)
Anesthesia Evaluation  Patient identified by MRN, date of birth, ID band Patient awake    Reviewed: Allergy & Precautions, NPO status , Patient's Chart, lab work & pertinent test results  History of Anesthesia Complications Negative for: history of anesthetic complications  Airway Mallampati: II  TM Distance: >3 FB Neck ROM: Full    Dental  (+) Dental Advisory Given   Pulmonary neg pulmonary ROS   Pulmonary exam normal breath sounds clear to auscultation       Cardiovascular hypertension (PIH),  Rhythm:Regular Rate:Normal     Neuro/Psych negative neurological ROS     GI/Hepatic negative GI ROS, Neg liver ROS,,,  Endo/Other  negative endocrine ROS    Renal/GU negative Renal ROS     Musculoskeletal   Abdominal   Peds  Hematology negative hematology ROS (+)   Anesthesia Other Findings   Reproductive/Obstetrics (+) Pregnancy                             Anesthesia Physical Anesthesia Plan  ASA: 2  Anesthesia Plan: Epidural   Post-op Pain Management:    Induction:   PONV Risk Score and Plan:   Airway Management Planned:   Additional Equipment:   Intra-op Plan:   Post-operative Plan:   Informed Consent: I have reviewed the patients History and Physical, chart, labs and discussed the procedure including the risks, benefits and alternatives for the proposed anesthesia with the patient or authorized representative who has indicated his/her understanding and acceptance.       Plan Discussed with: Anesthesiologist  Anesthesia Plan Comments: (I have discussed risks of neuraxial anesthesia including but not limited to infection, bleeding, nerve injury, back pain, headache, seizures, and failure of block. Patient denies bleeding disorders and is not currently anticoagulated. Labs have been reviewed. Risks and benefits discussed. All patient's questions answered.  )        Anesthesia Quick Evaluation

## 2022-05-26 NOTE — MAU Note (Signed)
500cc LR bolus infused and Dr Chestine Spore stated IV could be slowed since 500cc bolus infused.

## 2022-05-26 NOTE — Progress Notes (Signed)
Patient breathing through contractions, has had 3 rounds of IV fentanyl, is considering epidural within next hour or two. No other complaints  BP 121/86   Pulse (!) 119   Temp 98 F (36.7 C) (Oral)   Resp 16   Ht 5\' 4"  (1.626 m)   Wt 71.7 kg   SpO2 100%   BMI 27.12 kg/m  CE 1/50/-3, minimal change since admission Irregular activity on TOCO. Currently Cat 1 tracing, min var, + accels (c/w Fentanyl)  Amenable to augmentation with pitocin at this time. May have epidural if desired

## 2022-05-26 NOTE — Progress Notes (Signed)
Notified of 58m prolonged decel. Patient repositioned and pitocin turned down from 16 to 41mU/min. Now Cat 1 tracing, min to mod var, no accels or decels, TOCO q2-32m, MVUs 180  BP 108/60   Pulse (!) 108   Temp 99.7 F (37.6 C) (Axillary)   Resp 16   Ht 5\' 4"  (1.626 m)   Wt 71.7 kg   SpO2 100%   BMI 27.12 kg/m

## 2022-05-26 NOTE — MAU Note (Signed)
..  Kelsey Barnett is a 32 y.o. at [redacted]w[redacted]d here in MAU reporting:   Contractions every: 3-4 minutes Onset of ctx: Yesterday Pain score:  7/10  ROM:  upon arrival 0245  Vaginal Bleeding:  none  Last SVE: 1-2?  Epidural: Planning  Fetal Movement: Reports positive FM  FHT:  applied in room 130's  OB Office: [place] GBS: Negative HSV: Denies hx of HSV Lab orders placed from triage: MAU Labor Eval and Fern swab

## 2022-05-26 NOTE — H&P (Signed)
32 y.o. G1P0 @ [redacted]w[redacted]d presents with contractions.  At registration, she had rupture of membranes and was ruled in with a positive fern.  She was found to be in early labor at 1 cm.   While in MAU, she had a 4-5 minute deceleration to the 80s that occurred with IV placement.  She got pale, hot and nauseous during IV placement and then fetal deceleration occurred.  Likely vasovagal response. It resolved with position change and IVF bolus.    Upon my arrival, the deceleration had just resolved.  Baseline returned to 150s with moderate variability.   Her pregnancy has been uncomplicated.   Past Medical History:  Diagnosis Date   Pregnancy induced hypertension     Past Surgical History:  Procedure Laterality Date   BREAST REDUCTION SURGERY Bilateral 11/05/2019   Procedure: BILATERAL BREAST REDUCTION WITH LIPOSUCTION;  Surgeon: Peggye Form, DO;  Location: [facility];  Service: Plastics;  Laterality: Bilateral;   DRUG INDUCED ENDOSCOPY     WISDOM TOOTH EXTRACTION      OB History  Gravida Para Term Preterm AB Living  1            SAB IAB Ectopic Multiple Live Births               # Outcome Date GA Lbr Len/2nd Weight Sex Delivery Anes PTL Lv  1 Current             Social History   Socioeconomic History   Marital status: Married    Spouse name: Not on file   Number of children: Not on file   Years of education: Not on file   Highest education level: Not on file  Occupational History   Not on file  Tobacco Use   Smoking status: Never   Smokeless tobacco: Never  Vaping Use   Vaping Use: Never used  Substance and Sexual Activity   Alcohol use: Not Currently    Comment: occassionally   Drug use: Never   Sexual activity: Not on file  Other Topics Concern   Not on file  Social History Narrative   Not on file   Social Determinants of Health   Financial Resource Strain: Not on file  Food Insecurity: Not on file  Transportation Needs: Not on file  Physical  Activity: Not on file  Stress: Not on file  Social Connections: Not on file  Intimate Partner Violence: Not on file   Patient has no known allergies.    Prenatal Transfer Tool  Maternal Diabetes: No Genetic Screening: Normal Maternal Ultrasounds/Referrals: Normal Fetal Ultrasounds or other Referrals:  None Maternal Substance Abuse:  No Significant Maternal Medications:  None Significant Maternal Lab Results: Group B Strep negative  ABO, Rh: --/Positive/-- (09/11 0000) Antibody: Negative (09/11 0000) Rubella: Immune (09/11 0000) RPR: Nonreactive (09/11 0000)  HBsAg: Negative (09/11 0000)  HIV: Non-reactive (09/11 0000)  GBS: Negative/-- (03/11 0000)      Vitals:   05/26/22 0305  BP: 128/79  Pulse: (!) 125  Resp: 16  Temp: 98 F (36.7 C)  SpO2: 100%     General:  NAD, anxious Abdomen:  soft, gravid Ex:  trace edema SVE:  1/20/-2 per RN FHTs:  150s, moderate variability, category 1.  No further decelerations Toco:  q3 minutes on arrival   A/P   32 y.o. G1P0 105w4d presents with early labor and rupture of membranes Admit to L&D Is contracting currently.  Will reassess in a few hours and  add pitocin if needed Fetal heart deceleration was likely related to a vasovagal response with IV placement.  Continue IVF and monitor closely GBS negative    Piper Albro GEFFEL Leldon Steege

## 2022-05-26 NOTE — Anesthesia Procedure Notes (Signed)
Epidural Patient location during procedure: OB Start time: 05/26/2022 9:20 AM End time: 05/26/2022 9:25 AM  Staffing Anesthesiologist: Linton Rump, MD Performed: anesthesiologist   Preanesthetic Checklist Completed: patient identified, IV checked, site marked, risks and benefits discussed, surgical consent, monitors and equipment checked, pre-op evaluation and timeout performed  Epidural Patient position: sitting Prep: DuraPrep and site prepped and draped Patient monitoring: continuous pulse ox and blood pressure Approach: midline Location: L3-L4 Injection technique: LOR saline  Needle:  Needle type: Tuohy  Needle gauge: 17 G Needle length: 9 cm and 9 Needle insertion depth: 5 cm Catheter type: closed end flexible Catheter size: 19 Gauge Catheter at skin depth: 9 cm Test dose: negative  Assessment Events: blood not aspirated, no cerebrospinal fluid, injection not painful, no injection resistance, no paresthesia and negative IV test  Additional Notes The patient has requested an epidural for labor pain management. Risks and benefits including, but not limited to, infection, bleeding, local anesthetic toxicity, headache, hypotension, back pain, block failure, etc. were discussed with the patient. The patient expressed understanding and consented to the procedure. I confirmed that the patient has no bleeding disorders and is not taking blood thinners. I confirmed the patient's last platelet count with the nurse. A time-out was performed immediately prior to the procedure. Please see nursing documentation for vital signs. Sterile technique was used throughout the whole procedure. Once LOR achieved, the epidural catheter threaded easily without resistance. Aspiration of the catheter was negative for blood and CSF. The epidural was dosed slowly and an infusion was started.  1 attempt(s)Reason for block:procedure for pain

## 2022-05-26 NOTE — Progress Notes (Addendum)
Patient called out with pressure. Still 5-6 byut now 90/0 station. Continue to monitor BP (!) 113/57 (BP Location: Left Arm)   Pulse (!) 103   Temp 98.7 F (37.1 C) (Oral)   Resp 16   Ht 5\' 4"  (1.626 m)   Wt 71.7 kg   SpO2 100%   BMI 27.12 kg/m   ADDENDUM 2055: CE now 7/90/0, 15m late decel, improved with repositioning, TOCO q2-75m, reassuring at this time  BP 103/63   Pulse (!) 106   Temp 98.7 F (37.1 C) (Oral)   Resp 16   Ht 5\' 4"  (1.626 m)   Wt 71.7 kg   SpO2 100%   BMI 27.12 kg/m    ADDENDUM: Presented to bedside to reposition patient. Cat 2 tracing with repetitive lates while lying left lateral, pitocin at 12 mU/min. Placed in high Fowlers, tracing now cat 1, mod var, no decels, +accels. CE still 7/90/0 with some anterior cervical edema, MVUs adequate at 200 currently. Continue to monitor in position change  BP 128/81   Pulse (!) 109   Temp 98.7 F (37.1 C) (Oral)   Resp 16   Ht 5\' 4"  (1.626 m)   Wt 71.7 kg   SpO2 100%   BMI 27.12 kg/m

## 2022-05-26 NOTE — Progress Notes (Signed)
Comfortable s/p epidural. Continues small leakage of fluid BP 126/76   Pulse (!) 116   Temp 98.5 F (36.9 C) (Oral)   Resp 16   Ht 5\' 4"  (1.626 m)   Wt 71.7 kg   SpO2 100%   BMI 27.12 kg/m  CE 3/80/-2 Cat 1 tracing, TOCO q72m, pitocin at mU/min  Continue to titrate pitocin per protocol

## 2022-05-27 ENCOUNTER — Encounter (HOSPITAL_COMMUNITY): Payer: Self-pay | Admitting: Obstetrics

## 2022-05-27 ENCOUNTER — Encounter (HOSPITAL_COMMUNITY)
Admission: AD | Disposition: A | Discharge status: Home / Self Care | Payer: Self-pay | Source: Home / Self Care | Attending: Obstetrics and Gynecology

## 2022-05-27 ENCOUNTER — Other Ambulatory Visit: Payer: Self-pay

## 2022-05-27 LAB — CBC
HCT: 36.7 % (ref 36.0–46.0)
HCT: 32.4 % — ABNORMAL LOW (ref 36.0–46.0)
Hemoglobin: 12 g/dL (ref 12.0–15.0)
Hemoglobin: 11 g/dL — ABNORMAL LOW (ref 12.0–15.0)
MCH: 27.9 pg (ref 26.0–34.0)
MCH: 28.4 pg (ref 26.0–34.0)
MCHC: 32.7 g/dL (ref 30.0–36.0)
MCHC: 34 g/dL (ref 30.0–36.0)
MCV: 85.3 fL (ref 80.0–100.0)
MCV: 83.5 fL (ref 80.0–100.0)
Platelets: 145 10*3/uL — ABNORMAL LOW (ref 150–400)
Platelets: 138 10*3/uL — ABNORMAL LOW (ref 150–400)
RBC: 4.3 MIL/uL (ref 3.87–5.11)
RBC: 3.88 MIL/uL (ref 3.87–5.11)
RDW: 16.2 % — ABNORMAL HIGH (ref 11.5–15.5)
RDW: 16 % — ABNORMAL HIGH (ref 11.5–15.5)
WBC: 31 10*3/uL — ABNORMAL HIGH (ref 4.0–10.5)
WBC: 23.8 10*3/uL — ABNORMAL HIGH (ref 4.0–10.5)
nRBC: 0 % (ref 0.0–0.2)
nRBC: 0 % (ref 0.0–0.2)

## 2022-05-27 SURGERY — Surgical Case
Anesthesia: Epidural | Site: Abdomen

## 2022-05-27 MED ORDER — SODIUM CHLORIDE 0.9 % IV SOLN
500.0000 mg | INTRAVENOUS | Status: AC
  Administered 2022-05-27: 500 mg via INTRAVENOUS

## 2022-05-27 MED ORDER — SOD CITRATE-CITRIC ACID 500-334 MG/5ML PO SOLN
30.0000 mL | ORAL | Status: AC
  Administered 2022-05-27: 30 mL via ORAL

## 2022-05-27 MED ORDER — ACETAMINOPHEN 500 MG PO TABS
1000.0000 mg | ORAL_TABLET | Freq: Four times a day (QID) | ORAL | Status: DC
  Administered 2022-05-27 – 2022-05-29 (×8): 1000 mg via ORAL
  Filled 2022-05-27 (×9): qty 2

## 2022-05-27 MED ORDER — HYDROMORPHONE HCL 1 MG/ML IJ SOLN: 0.2500 mg | INTRAMUSCULAR | Status: DC | PRN

## 2022-05-27 MED ORDER — SODIUM CHLORIDE 0.9 % IV SOLN
2.0000 g | Freq: Four times a day (QID) | INTRAVENOUS | Status: AC
  Administered 2022-05-27 (×4): 2 g via INTRAVENOUS
  Filled 2022-05-27 (×4): qty 2000

## 2022-05-27 MED ORDER — ONDANSETRON HCL 4 MG/2ML IJ SOLN
INTRAMUSCULAR | Status: DC | PRN
  Administered 2022-05-27: 4 mg via INTRAVENOUS

## 2022-05-27 MED ORDER — SIMETHICONE 80 MG PO CHEW
80.0000 mg | CHEWABLE_TABLET | Freq: Three times a day (TID) | ORAL | Status: DC
  Administered 2022-05-27 – 2022-05-29 (×8): 80 mg via ORAL
  Filled 2022-05-27 (×8): qty 1

## 2022-05-27 MED ORDER — KETOROLAC TROMETHAMINE 30 MG/ML IJ SOLN
30.0000 mg | Freq: Four times a day (QID) | INTRAMUSCULAR | Status: AC
  Administered 2022-05-27 – 2022-05-28 (×4): 30 mg via INTRAVENOUS
  Filled 2022-05-27 (×4): qty 1

## 2022-05-27 MED ORDER — SENNOSIDES-DOCUSATE SODIUM 8.6-50 MG PO TABS
2.0000 | ORAL_TABLET | Freq: Every day | ORAL | Status: DC
  Administered 2022-05-28 – 2022-05-29 (×2): 2 via ORAL
  Filled 2022-05-27 (×2): qty 2

## 2022-05-27 MED ORDER — NALOXONE HCL 0.4 MG/ML IJ SOLN: 0.4000 mg | INTRAMUSCULAR | Status: DC | PRN

## 2022-05-27 MED ORDER — KETOROLAC TROMETHAMINE 30 MG/ML IJ SOLN: 30.0000 mg | Freq: Four times a day (QID) | INTRAMUSCULAR | Status: DC | PRN

## 2022-05-27 MED ORDER — ONDANSETRON HCL 4 MG/2ML IJ SOLN
INTRAMUSCULAR | Status: AC
  Filled 2022-05-27: qty 2

## 2022-05-27 MED ORDER — MORPHINE SULFATE (PF) 0.5 MG/ML IJ SOLN
INTRAMUSCULAR | Status: DC | PRN
  Administered 2022-05-27: 3 mg via EPIDURAL

## 2022-05-27 MED ORDER — CLINDAMYCIN PHOSPHATE 900 MG/50ML IV SOLN
900.0000 mg | Freq: Three times a day (TID) | INTRAVENOUS | Status: AC
  Administered 2022-05-27 (×3): 900 mg via INTRAVENOUS
  Filled 2022-05-27 (×3): qty 50

## 2022-05-27 MED ORDER — SODIUM CHLORIDE 0.9 % IR SOLN
Status: DC | PRN
  Administered 2022-05-27: 1

## 2022-05-27 MED ORDER — KETOROLAC TROMETHAMINE 30 MG/ML IJ SOLN
30.0000 mg | Freq: Once | INTRAMUSCULAR | Status: AC | PRN
  Administered 2022-05-27: 30 mg via INTRAVENOUS

## 2022-05-27 MED ORDER — DIBUCAINE (PERIANAL) 1 % EX OINT: 1.0000 | TOPICAL_OINTMENT | CUTANEOUS | Status: DC | PRN

## 2022-05-27 MED ORDER — TRANEXAMIC ACID-NACL 1000-0.7 MG/100ML-% IV SOLN
INTRAVENOUS | Status: DC | PRN
  Administered 2022-05-27: 1000 mg via INTRAVENOUS

## 2022-05-27 MED ORDER — COCONUT OIL OIL: 1.0000 | TOPICAL_OIL | Status: DC | PRN

## 2022-05-27 MED ORDER — SIMETHICONE 80 MG PO CHEW
80.0000 mg | CHEWABLE_TABLET | ORAL | Status: DC | PRN
  Administered 2022-05-28: 80 mg via ORAL
  Filled 2022-05-27: qty 1

## 2022-05-27 MED ORDER — SCOPOLAMINE 1 MG/3DAYS TD PT72
MEDICATED_PATCH | TRANSDERMAL | Status: AC
  Filled 2022-05-27: qty 1

## 2022-05-27 MED ORDER — IBUPROFEN 600 MG PO TABS
600.0000 mg | ORAL_TABLET | Freq: Four times a day (QID) | ORAL | Status: DC
  Administered 2022-05-28 – 2022-05-29 (×5): 600 mg via ORAL
  Filled 2022-05-27 (×5): qty 1

## 2022-05-27 MED ORDER — ZOLPIDEM TARTRATE 5 MG PO TABS: 5.0000 mg | ORAL_TABLET | Freq: Every evening | ORAL | Status: DC | PRN

## 2022-05-27 MED ORDER — ONDANSETRON HCL 4 MG/2ML IJ SOLN: 4.0000 mg | Freq: Once | INTRAMUSCULAR | Status: DC | PRN

## 2022-05-27 MED ORDER — MEPERIDINE HCL 25 MG/ML IJ SOLN: 6.2500 mg | INTRAMUSCULAR | Status: DC | PRN

## 2022-05-27 MED ORDER — DIPHENHYDRAMINE HCL 50 MG/ML IJ SOLN: 12.5000 mg | INTRAMUSCULAR | Status: DC | PRN

## 2022-05-27 MED ORDER — OXYTOCIN-SODIUM CHLORIDE 30-0.9 UT/500ML-% IV SOLN
INTRAVENOUS | Status: DC | PRN
  Administered 2022-05-27: 30 [IU] via INTRAVENOUS

## 2022-05-27 MED ORDER — OXYCODONE HCL 5 MG/5ML PO SOLN: 5.0000 mg | Freq: Once | ORAL | Status: DC | PRN

## 2022-05-27 MED ORDER — GENTAMICIN SULFATE 40 MG/ML IJ SOLN: 5.0000 mg/kg | Freq: Once | INTRAVENOUS | Status: DC

## 2022-05-27 MED ORDER — ACETAMINOPHEN 500 MG PO TABS: 1000.0000 mg | ORAL_TABLET | Freq: Four times a day (QID) | ORAL | Status: DC

## 2022-05-27 MED ORDER — OXYCODONE HCL 5 MG PO TABS
5.0000 mg | ORAL_TABLET | ORAL | Status: DC | PRN
  Administered 2022-05-29 (×2): 5 mg via ORAL
  Filled 2022-05-27 (×2): qty 1

## 2022-05-27 MED ORDER — DIPHENHYDRAMINE HCL 25 MG PO CAPS: 25.0000 mg | ORAL_CAPSULE | ORAL | Status: DC | PRN

## 2022-05-27 MED ORDER — PRENATAL MULTIVITAMIN CH
1.0000 | ORAL_TABLET | Freq: Every day | ORAL | Status: DC
  Administered 2022-05-27 – 2022-05-29 (×3): 1 via ORAL
  Filled 2022-05-27 (×3): qty 1

## 2022-05-27 MED ORDER — WITCH HAZEL-GLYCERIN EX PADS: 1.0000 | MEDICATED_PAD | CUTANEOUS | Status: DC | PRN

## 2022-05-27 MED ORDER — KETOROLAC TROMETHAMINE 30 MG/ML IJ SOLN
INTRAMUSCULAR | Status: AC
  Filled 2022-05-27: qty 1

## 2022-05-27 MED ORDER — ONDANSETRON HCL 4 MG/2ML IJ SOLN: 4.0000 mg | Freq: Three times a day (TID) | INTRAMUSCULAR | Status: DC | PRN

## 2022-05-27 MED ORDER — NALOXONE HCL 4 MG/10ML IJ SOLN: 1.0000 ug/kg/h | INTRAVENOUS | Status: DC | PRN

## 2022-05-27 MED ORDER — SCOPOLAMINE 1 MG/3DAYS TD PT72
1.0000 | MEDICATED_PATCH | Freq: Once | TRANSDERMAL | Status: DC
  Administered 2022-05-27: 1.5 mg via TRANSDERMAL

## 2022-05-27 MED ORDER — OXYTOCIN-SODIUM CHLORIDE 30-0.9 UT/500ML-% IV SOLN: 2.5000 [IU]/h | INTRAVENOUS | Status: AC

## 2022-05-27 MED ORDER — MEPERIDINE HCL 25 MG/ML IJ SOLN
INTRAMUSCULAR | Status: AC
  Filled 2022-05-27: qty 1

## 2022-05-27 MED ORDER — MORPHINE SULFATE (PF) 0.5 MG/ML IJ SOLN
INTRAMUSCULAR | Status: AC
  Filled 2022-05-27: qty 10

## 2022-05-27 MED ORDER — MENTHOL 3 MG MT LOZG: 1.0000 | LOZENGE | OROMUCOSAL | Status: DC | PRN

## 2022-05-27 MED ORDER — DIPHENHYDRAMINE HCL 25 MG PO CAPS: 25.0000 mg | ORAL_CAPSULE | Freq: Four times a day (QID) | ORAL | Status: DC | PRN

## 2022-05-27 MED ORDER — DEXAMETHASONE SODIUM PHOSPHATE 10 MG/ML IJ SOLN
INTRAMUSCULAR | Status: AC
  Filled 2022-05-27: qty 1

## 2022-05-27 MED ORDER — LACTATED RINGERS IV SOLN: INTRAVENOUS | Status: DC

## 2022-05-27 MED ORDER — DEXAMETHASONE SODIUM PHOSPHATE 10 MG/ML IJ SOLN
INTRAMUSCULAR | Status: DC | PRN
  Administered 2022-05-27: 10 mg via INTRAVENOUS

## 2022-05-27 MED ORDER — AMISULPRIDE (ANTIEMETIC) 5 MG/2ML IV SOLN: 10.0000 mg | Freq: Once | INTRAVENOUS | Status: DC | PRN

## 2022-05-27 MED ORDER — FENTANYL CITRATE (PF) 100 MCG/2ML IJ SOLN
INTRAMUSCULAR | Status: DC | PRN
  Administered 2022-05-27: 100 ug via EPIDURAL

## 2022-05-27 MED ORDER — PHENYLEPHRINE 80 MCG/ML (10ML) SYRINGE FOR IV PUSH (FOR BLOOD PRESSURE SUPPORT)
PREFILLED_SYRINGE | INTRAVENOUS | Status: DC | PRN
  Administered 2022-05-27: 80 ug via INTRAVENOUS

## 2022-05-27 MED ORDER — OXYTOCIN-SODIUM CHLORIDE 30-0.9 UT/500ML-% IV SOLN
INTRAVENOUS | Status: AC
  Filled 2022-05-27: qty 500

## 2022-05-27 MED ORDER — MEPERIDINE HCL 25 MG/ML IJ SOLN
6.2500 mg | INTRAMUSCULAR | Status: DC | PRN
  Administered 2022-05-27: 6.25 mg via INTRAVENOUS

## 2022-05-27 MED ORDER — OXYCODONE HCL 5 MG PO TABS: 5.0000 mg | ORAL_TABLET | Freq: Once | ORAL | Status: DC | PRN

## 2022-05-27 MED ORDER — LIDOCAINE-EPINEPHRINE (PF) 2 %-1:200000 IJ SOLN
INTRAMUSCULAR | Status: DC | PRN
  Administered 2022-05-27: 4 mL via EPIDURAL
  Administered 2022-05-27: 10 mL via EPIDURAL

## 2022-05-27 MED ORDER — TRANEXAMIC ACID-NACL 1000-0.7 MG/100ML-% IV SOLN
INTRAVENOUS | Status: AC
  Filled 2022-05-27: qty 100

## 2022-05-27 MED ORDER — GENTAMICIN SULFATE 40 MG/ML IJ SOLN
5.0000 mg/kg | Freq: Once | INTRAVENOUS | Status: AC
  Administered 2022-05-27: 360 mg via INTRAVENOUS
  Filled 2022-05-27: qty 9

## 2022-05-27 MED ORDER — CEFAZOLIN SODIUM-DEXTROSE 2-4 GM/100ML-% IV SOLN
INTRAVENOUS | Status: AC
  Filled 2022-05-27: qty 100

## 2022-05-27 MED ORDER — FENTANYL CITRATE (PF) 100 MCG/2ML IJ SOLN
INTRAMUSCULAR | Status: AC
  Filled 2022-05-27: qty 2

## 2022-05-27 MED ORDER — CEFAZOLIN SODIUM-DEXTROSE 2-4 GM/100ML-% IV SOLN
2.0000 g | INTRAVENOUS | Status: AC
  Administered 2022-05-27: 2 g via INTRAVENOUS

## 2022-05-27 MED ORDER — SODIUM CHLORIDE 0.9% FLUSH: 3.0000 mL | INTRAVENOUS | Status: DC | PRN

## 2022-05-27 SURGICAL SUPPLY — 33 items
CHLORAPREP W/TINT 26 (MISCELLANEOUS) ×2 IMPLANT
CLAMP UMBILICAL CORD (MISCELLANEOUS) ×1 IMPLANT
CLOTH BEACON ORANGE TIMEOUT ST (SAFETY) ×1 IMPLANT
DERMABOND ADVANCED .7 DNX12 (GAUZE/BANDAGES/DRESSINGS) ×1 IMPLANT
DRSG OPSITE POSTOP 4X10 (GAUZE/BANDAGES/DRESSINGS) ×1 IMPLANT
ELECT REM PT RETURN 9FT ADLT (ELECTROSURGICAL) ×1
ELECTRODE REM PT RTRN 9FT ADLT (ELECTROSURGICAL) ×1 IMPLANT
EXTRACTOR VACUUM BELL STYLE (SUCTIONS) IMPLANT
GAUZE SPONGE 4X4 12PLY STRL LF (GAUZE/BANDAGES/DRESSINGS) IMPLANT
GLOVE BIO SURGEON STRL SZ 6.5 (GLOVE) ×1 IMPLANT
GLOVE BIOGEL PI IND STRL 6.5 (GLOVE) ×1 IMPLANT
GLOVE BIOGEL PI IND STRL 7.0 (GLOVE) ×2 IMPLANT
GOWN STRL REUS W/TWL LRG LVL3 (GOWN DISPOSABLE) ×2 IMPLANT
KIT ABG SYR 3ML LUER SLIP (SYRINGE) IMPLANT
NDL HYPO 25X5/8 SAFETYGLIDE (NEEDLE) IMPLANT
NEEDLE HYPO 25X5/8 SAFETYGLIDE (NEEDLE) IMPLANT
NS IRRIG 1000ML POUR BTL (IV SOLUTION) ×1 IMPLANT
PACK C SECTION WH (CUSTOM PROCEDURE TRAY) ×1 IMPLANT
PAD ABD 8X10 STRL (GAUZE/BANDAGES/DRESSINGS) IMPLANT
PAD OB MATERNITY 4.3X12.25 (PERSONAL CARE ITEMS) ×1 IMPLANT
RTRCTR C-SECT PINK 25CM LRG (MISCELLANEOUS) IMPLANT
SUT PDS AB 0 CTX 36 PDP370T (SUTURE) IMPLANT
SUT PLAIN 2 0 (SUTURE)
SUT PLAIN ABS 2-0 CT1 27XMFL (SUTURE) IMPLANT
SUT VIC AB 0 CT1 36 (SUTURE) ×2 IMPLANT
SUT VIC AB 2-0 CT1 27 (SUTURE) ×1
SUT VIC AB 2-0 CT1 TAPERPNT 27 (SUTURE) ×1 IMPLANT
SUT VIC AB 3-0 SH 27 (SUTURE)
SUT VIC AB 3-0 SH 27X BRD (SUTURE) IMPLANT
SUT VIC AB 4-0 KS 27 (SUTURE) ×1 IMPLANT
TOWEL OR 17X24 6PK STRL BLUE (TOWEL DISPOSABLE) ×1 IMPLANT
TRAY FOLEY W/BAG SLVR 14FR LF (SET/KITS/TRAYS/PACK) ×1 IMPLANT
WATER STERILE IRR 1000ML POUR (IV SOLUTION) ×1 IMPLANT

## 2022-05-27 NOTE — Progress Notes (Signed)
Subjective: Postpartum Day 0: Cesarean Delivery Patient reports pain currently well-controlled.  Foley catheter still in place.  Has not gotten up to ambulate.  Baby has been transferred out of the NICU.  Objective: Vital signs in last 24 hours: Temp:  [97.5 F (36.4 C)-99.8 F (37.7 C)] 97.6 F (36.4 C) (04/13 0739) Pulse Rate:  [83-133] 89 (04/13 0739) Resp:  [16-21] 16 (04/13 0739) BP: (100-145)/(51-95) 119/82 (04/13 0739) SpO2:  [96 %-98 %] 98 % (04/13 0739)  Physical Exam:  General: alert, cooperative, and appears stated age Lochia: appropriate Uterine Fundus: firm Incision: healing well DVT Evaluation: No evidence of DVT seen on physical exam.  Recent Labs    05/27/22 0111 05/27/22 0523  HGB 12.0 11.0*  HCT 36.7 32.4*    Assessment/Plan: Status post Cesarean section. Doing well postoperatively.  Continue current care. Chorioamnionitis: Continue amp and clinda for 24 hours following delivery Desires neonatal circumcision, R/B/A of procedure discussed at length. Pt understands that neonatal circumcision is not considered medically necessary and is elective. The risks include, but are not limited to bleeding, infection, damage to the penis, development of scar tissue, and having to have it redone at a later date. Pt understands theses risks and wishes to proceed.  Given day of life 0 and recent transfer out of NICU will postpone until tomorrow   Waynard Reeds, MD 05/27/2022, 11:02 AM

## 2022-05-27 NOTE — Progress Notes (Addendum)
Presented to bedside for repeat cervical exam. CE now unchanged >4hrs with adequate MVUs - 7/90/0, worsening cervical edema despite position changes. Meets requirements for PLTCS for arrest of dilation R/B/A of cesarean section discussed with patient. Alternative would be vaginal delivery which would mean shorter postpartum stay and decreased risk of bleeding. Risks of section include infection of the uterus, pelvic organs, or skin, inadvertent injury to internal organs, such as bowel or bladder. If there is major injury, extensive surgery may be required. If injury is minor, it may be treated with relative ease. Discussed possibility of excessive blood loss and transfusion. If bleeding cannot be controlled using medical or minor surgical methods, a cesarean hysterectomy may be performed which would mean no future fertility. Patient accepts the possibility of blood transfusion, if necessary. Patient understands and agrees to move forward with section.  Ancef and azithro ordered

## 2022-05-27 NOTE — Anesthesia Postprocedure Evaluation (Signed)
Anesthesia Post Note  Patient: Kelsey Barnett  Procedure(s) Performed: CESAREAN SECTION (Abdomen)     Patient location during evaluation: PACU Anesthesia Type: Epidural Level of consciousness: awake and alert and oriented Pain management: pain level controlled Vital Signs Assessment: post-procedure vital signs reviewed and stable Respiratory status: spontaneous breathing, nonlabored ventilation and respiratory function stable Cardiovascular status: blood pressure returned to baseline and stable Postop Assessment: no headache, no backache, epidural receding and no apparent nausea or vomiting Anesthetic complications: no   No notable events documented.  Last Vitals:  Vitals:   05/27/22 0300 05/27/22 0315  BP: 113/81 117/79  Pulse: (!) 104 98  Resp: (!) 21 17  Temp:    SpO2: 96% 96%    Last Pain:  Vitals:   05/27/22 0240  TempSrc:   PainSc: 3    Pain Goal: Patients Stated Pain Goal: 0 (05/27/22 0240)  LLE Motor Response: Purposeful movement (05/27/22 0315) LLE Sensation: Tingling (05/27/22 0315) RLE Motor Response: Purposeful movement (05/27/22 0315) RLE Sensation: Tingling (05/27/22 0315) L Sensory Level: L2-Upper inner thigh, upper buttock (05/27/22 0315) R Sensory Level: L2-Upper inner thigh, upper buttock (05/27/22 0315) Epidural/Spinal Function Cutaneous sensation: Tingles (05/27/22 0315), Patient able to flex knees: Yes (05/27/22 0315), Patient able to lift hips off bed: Yes (05/27/22 0315), Back pain beyond tenderness at insertion site: No (05/27/22 0315), Progressively worsening motor and/or sensory loss: No (05/27/22 0315), Bowel and/or bladder incontinence post epidural: No (05/27/22 0315)  Lannie Fields

## 2022-05-27 NOTE — Lactation Note (Signed)
This note was copied from a baby's chart. Lactation Consultation Note  Patient Name: Kelsey Barnett [id number] Date: 05/27/2022 Age:31 hours Reason for consult: Initial assessment;Term;1st time breastfeeding;Breastfeeding assistance;Breast reduction (bilateral breast reduction (2021))  [facility] student visited room for initial assessment. Infant was asleep in bassinet. Per MOB and FOB, infant was fed 49ml of donor milk through bottle 3 hours prior. LC recommended attempting a latch. Infant was reluctant to opening his mouth to obtain a deep latch. MOB was able to hand express colostrum to try to enticed infant to latch. Infant fell asleep STS with MOB. LC student educated MOB on the significance of stimulating the breast. Sized flanges for pump. 82mm was too big, nipple was not able to reach tunnel for stimulation. Recommended MOB to hand express into colostrum collectors.   Maternal Data Has patient been taught Hand Expression?: Yes Does the patient have breastfeeding experience prior to this delivery?: No  Feeding Mother's Current Feeding Choice: Breast Milk and Formula Nipple Type: Nfant Slow Flow (purple)  LATCH Score Latch: Too sleepy or reluctant, no latch achieved, no sucking elicited.  Audible Swallowing: None  Type of Nipple: Everted at rest and after stimulation  Comfort (Breast/Nipple): Soft / non-tender  Hold (Positioning): Full assist, staff holds infant at breast  LATCH Score: 4   Lactation Tools Discussed/Used Tools: Flanges;Pump Flange Size: 21 (patient is smaller than 40mm.) Breast pump type: Double-Electric Breast Pump Pump Education: Setup, frequency, and cleaning;Milk Storage Reason for Pumping: Stimulation and supplementation Pumping frequency: q3hr  Interventions Interventions: Breast feeding basics reviewed;Adjust position;DEBP;Assisted with latch;Support pillows;Skin to skin;Position options;Education;Pace feeding;Hand express  Discharge Pump: Hands  Free;Personal  Consult Status Consult Status: Follow-up Date: 05/28/22 Follow-up type: In-patient    Florence Yeung 05/27/2022, 12:21 PM

## 2022-05-27 NOTE — Transfer of Care (Signed)
Immediate Anesthesia Transfer of Care Note  Patient: Kelsey Barnett  Procedure(s) Performed: CESAREAN SECTION (Abdomen)  Patient Location: PACU  Anesthesia Type:Epidural  Level of Consciousness: awake, alert , and oriented  Airway & Oxygen Therapy: Patient Spontanous Breathing  Post-op Assessment: Report given to RN and Post -op Vital signs reviewed and stable  Post vital signs: Reviewed and stable  Last Vitals:  Vitals Value Taken Time  BP 145/88 05/27/22 0240  Temp    Pulse 111 05/27/22 0243  Resp 18 05/27/22 0243  SpO2 98 % 05/27/22 0243  Vitals shown include unvalidated device data.  Last Pain:  Vitals:   05/26/22 2312  TempSrc:   PainSc: 4       Patients Stated Pain Goal: 0 (05/26/22 0914)  Complications: No notable events documented.

## 2022-05-27 NOTE — Op Note (Signed)
C-Section Operative Note  Date: 05/27/22  Preoperative Diagnosis: IUP @ 40 5/7 Postoperative Diagnosis: same as above, suspected chorioamnionitis Procedure: PLTCS Indication: arrest of dilation Findings: Viable female infant (ROA) weighing 3710g (8lb2.9oz) with APGARS of 4, 7 and 8 at 1, 5 and 10 minutes, respectively. Normal appearing uterus with frank meconium and foul odor upon delivery. Normal appearing bilateral fallopian tubes and ovaries. Specimens: cord gases, placenta to pathology EBL 962cc IVF 1500 UOP 100 dark urine  Patient Course: Patient admitted with SROM to L&D at 40 4/7. Of note, prolonged decel prior to arrival but this was immediately after IV placement and thought to be 2/2 vasovagal. Was contracting somewhat regularly however did not progress beyond 1cm by day shift. Augmentation with pitocin began and patient progressed through latent labor. During transition to active labor, had 3 separate instances of Cat 2 tracings that improved with repositioning and turning down pitocin. IUPC placed and adequate MVUs were achieved with patient reaching 7cm. However, did not progress beyond this despite adequacy for >4hrs; additionally, cervical swelling noted despite position changes. Consented for PLTCS at this time  Consent:  R/B/A of cesarean section discussed with patient. Alternative would be vaginal delivery which would mean shorter postpartum stay and decreased risk of bleeding. Risks of section include infection of the uterus, pelvic organs, or skin, inadvertent injury to internal organs, such as bowel or bladder. If there is major injury, extensive surgery may be required. If injury is minor, it may be treated with relative ease. Discussed possibility of excessive blood loss and transfusion. If bleeding cannot be controlled using medical or minor surgical methods, a cesarean hysterectomy may be performed which would mean no future fertility. Patient accepts the possibility of blood  transfusion, if necessary. Patient understands and agrees to move forward with section.   Operative Procedure: Patient was taken to the operating room where epidural anesthesia was found to be adequate by Allis clamp test. She was prepped and draped in the normal sterile fashion in the dorsal supine position with a leftward tilt. An appropriate time out was performed. A Pfannenstiel skin incision was then made with the scalpel and carried through to the underlying layer of fascia by sharp dissection and Bovie cautery. The fascia was nicked in the midline and the incision was extended laterally with Mayo scissors. The superior aspect of the incision was grasped Coker clamps and dissected off the underlying rectus muscles. In a similar fashion the inferior aspect was dissected off the rectus muscles. Rectus muscles were separated in the midline and the peritoneal cavity entered bluntly. The peritoneal incision was then extended both superiorly and inferiorly with careful attention to avoid both bowel and bladder. The Alexis self-retaining wound retractor was then placed within the incision and the lower uterine segment exposed. The bladder flap was developed with Metzenbaum scissors and pushed away from the lower uterine segment. The lower uterine segment was then incised in a low transverse fashion and the cavity itself entered bluntly. The incision was extended bluntly. Amniotic sac was ruptured and frank meconium was noted along with foul odor. The infant's head was then lifted and delivered from the incision without difficulty using the standard movements. The remainder of the infant delivered and the nose and mouth bulb suctioned with the cord clamped and cut as well. The infant was handed off immediately to NICU given no cry and poor respiratory effort. The placenta was then spontaneously expressed from the uterus and the uterus cleared of all clots and debris  with moist lap sponge. Placenta appeared  calcified and with significant meconium staining. 1g TXA ordered at this time. The uterine incision was then repaired in 2 layers the first layer was a running locked layer of 0-vicryl and the second an imbricating layer of the same suture. The tubes and ovaries were inspected and the gutters cleared of all clots and debris. The uterine incision was inspected and found to be hemostatic. All instruments and sponges as well as the Alexis retractor were then removed from the abdomen. The peritoneum was then reapproximated using 2-0 vicryl suture. The fascia was then closed with 0 Vicryl in a running fashion The skin was closed with a subcuticular stitch of 4-0 Vicryl on a Keith needle and then reinforced with Dermabond and a Keith needle. At the conclusion of the procedure all instruments and sponge counts were correct. Patient was taken to the recovery room in good condition  Baby boy was taken to NICU for observation at this time. Of note, baby BG 129.  CBC drawn prior to section shows WBC 31 though patient never was febrile. Given that plus odor during delivery, will treat as chorioamnionitis with abx 24hrs (Amp/gent/clinda)

## 2022-05-28 ENCOUNTER — Encounter (HOSPITAL_COMMUNITY): Payer: Self-pay | Admitting: Obstetrics and Gynecology

## 2022-05-28 MED ORDER — CALCIUM CARBONATE ANTACID 500 MG PO CHEW
2.0000 | CHEWABLE_TABLET | Freq: Three times a day (TID) | ORAL | Status: DC | PRN
  Administered 2022-05-28: 400 mg via ORAL
  Filled 2022-05-28: qty 2

## 2022-05-28 NOTE — Progress Notes (Signed)
Subjective: Postpartum Day 1: Cesarean Delivery Patient reports pain controlled, no nausea or vomiting. Tolerating po, ambulating and voiding without difficulty  Objective: Vital signs in last 24 hours: Temp:  [97.4 F (36.3 C)-97.9 F (36.6 C)] 97.7 F (36.5 C) (04/14 0824) Pulse Rate:  [71-88] 84 (04/14 0824) Resp:  [16] 16 (04/14 0824) BP: (93-102)/(50-61) 101/61 (04/14 0824) SpO2:  [95 %-99 %] 98 % (04/14 0824)  Physical Exam:  General: alert, cooperative, and appears stated age Lochia: appropriate Uterine Fundus: firm Incision: healing well DVT Evaluation: No evidence of DVT seen on physical exam.  Recent Labs    05/27/22 0111 05/27/22 0523  HGB 12.0 11.0*  HCT 36.7 32.4*    Assessment/Plan: Status post Cesarean section. Doing well postoperatively.  Continue current care. Plan circ today  Waynard Reeds, MD 05/28/2022, 11:26 AM

## 2022-05-29 ENCOUNTER — Inpatient Hospital Stay (HOSPITAL_COMMUNITY)
Admission: RE | Admit: 2022-05-29 | Payer: BC Managed Care – PPO | Source: Home / Self Care | Admitting: Obstetrics and Gynecology

## 2022-05-29 ENCOUNTER — Inpatient Hospital Stay (HOSPITAL_COMMUNITY): Payer: BC Managed Care – PPO

## 2022-05-29 MED ORDER — OXYCODONE HCL 5 MG PO TABS: 5.0000 mg | ORAL_TABLET | ORAL | 0 refills | Status: AC | PRN

## 2022-05-29 MED ORDER — IBUPROFEN 600 MG PO TABS: 600.0000 mg | ORAL_TABLET | Freq: Four times a day (QID) | ORAL | 0 refills | Status: AC

## 2022-05-29 NOTE — Progress Notes (Signed)
POD #2 Doing well, sore but ok Afeb, VSS Abd- soft, fundus firm, incision intact Continue routine care, d/c home this pm

## 2022-05-29 NOTE — Lactation Note (Signed)
This note was copied from a baby's chart. Lactation Consultation Note  Patient Name: Boy Pagan Loveall [id number] Date: 05/29/2022 Age:31 hours Reason for consult: Follow-up assessment;1st time breastfeeding;Term;Breast reduction  P1, Bilateral breast reduction 2021.   Baby has been primarily been receiving donor milk. Referred mother to [email or web address] and suggest she pump q 3hours. Mother aware to purchase smaller flanges for her home pump. Parents state baby is sleepy at the breast. Recommend unwrapping baby for feedings and feed skin to skin if sleepy. Unwrapped baby and baby was able to latch on both breasts. Discussed the importance of breast stimulation. Plan: Allow baby to breastfeed.  Supplement with ebm/donor milk or formula and have mother post pump with DEBP.  Feed on demand with cues.  Goal 8-12+ times per day after first 24 hrs.  Place baby STS if not cueing.  Give volume pumped back to baby with the difference with formula.  Pump at least q 3 hours.  Reviewed engorgement care and monitoring voids/stools. Message sent for OP Lactation appt.    Maternal Data Has patient been taught Hand Expression?: Yes  Feeding Mother's Current Feeding Choice: Breast Milk and Formula  LATCH Score Latch: Grasps breast easily, tongue down, lips flanged, rhythmical sucking.  Audible Swallowing: A few with stimulation  Type of Nipple: Everted at rest and after stimulation  Comfort (Breast/Nipple): Soft / non-tender  Hold (Positioning): Assistance needed to correctly position infant at breast and maintain latch.  LATCH Score: 8   Lactation Tools Discussed/Used Tools: Pump Flange Size: 18 Breast pump type: Double-Electric Breast Pump Reason for Pumping: stimulation and supplementation Pumping frequency:  (q 3 hours)  Interventions Interventions: Hand express;DEBP;Education  Discharge Discharge Education: Engorgement and breast care;Warning signs for feeding baby;Outpatient Epic  message sent Pump: Personal;Hands Free  Consult Status Consult Status: Complete Date: 05/29/22    Dahlia Byes [facility] 05/29/2022, 3:19 PM

## 2022-05-29 NOTE — Discharge Summary (Signed)
Postpartum Discharge Summary      Patient Name: Kelsey Barnett DOB: 1990/02/15 MRN: [id number]  Date of admission: 05/26/2022 Delivery date:05/27/2022  Delivering provider: Carlisle Cater  Date of discharge: 05/29/2022  Admitting diagnosis: Full-term premature rupture of membranes (PROM) with unknown onset of labor [O42.90] Intrauterine pregnancy: [redacted]w[redacted]d     Secondary diagnosis:  Principal Problem:   Full-term premature rupture of membranes (PROM) with unknown onset of labor     Discharge diagnosis: Term Pregnancy Delivered and arrest of dilation                                                Hospital course: Onset of Labor With Unplanned C/S   32 y.o. yo G1P1001 at [redacted]w[redacted]d was admitted in Latent Labor with PROM on 05/26/2022. Patient had a labor course significant for protracted labor. The patient went for cesarean section due to Arrest of Dilation. Delivery details as follows: Membrane Rupture Time/Date: 2:45 AM ,05/26/2022   Delivery Method:C-Section, Low Transverse  Details of operation can be found in separate operative note. Patient had a postpartum course complicated by nothing.  She received antibiotics for 24 hrs for possible chorioamnionitis, remained afebrile.  She is ambulating,tolerating a regular diet, passing flatus, and urinating well.  Patient is discharged home in stable condition 05/29/22.  Newborn Data: Birth date:05/27/2022  Birth time:1:48 AM  Gender:Female  Living status:Living  Apgars:4 ,7  Weight:3710 g   Physical exam  Vitals:   05/28/22 1511 05/28/22 1943 05/29/22 0515 05/29/22 0840  BP: 108/68 106/67 113/75 113/70  Pulse: 81 82 72 78  Resp: 16 19 18 18   Temp: 98.1 F (36.7 C) 98 F (36.7 C) 97.7 F (36.5 C) (!) 97.5 F (36.4 C)  TempSrc: Oral Oral Oral Oral  SpO2: 98% 100% 100% 99%  Weight:      Height:       General: alert Lochia: appropriate Uterine Fundus: firm Incision: Healing well with no significant drainage  Labs: Lab Results   Component Value Date   WBC 23.8 (H) 05/27/2022   HGB 11.0 (L) 05/27/2022   HCT 32.4 (L) 05/27/2022   MCV 83.5 05/27/2022   PLT 138 (L) 05/27/2022      Latest Ref Rng & Units 05/23/2022    2:18 PM  CMP  Glucose 70 - 99 mg/dL 956   BUN 6 - 20 mg/dL 5   Creatinine 3.87 - 5.64 mg/dL 3.32   Sodium 951 - 884 mmol/L 136   Potassium 3.5 - 5.1 mmol/L 3.7   Chloride 98 - 111 mmol/L 107   CO2 22 - 32 mmol/L 19   Calcium 8.9 - 10.3 mg/dL 8.6   Total Protein 6.5 - 8.1 g/dL 6.3   Total Bilirubin 0.3 - 1.2 mg/dL 0.4   Alkaline Phos 38 - 126 U/L 218   AST 15 - 41 U/L 25   ALT 0 - 44 U/L 15    Edinburgh Score:    05/28/2022    4:00 PM  Edinburgh Postnatal Depression Scale Screening Tool  I have been able to laugh and see the funny side of things. 0  I have looked forward with enjoyment to things. 0  I have blamed myself unnecessarily when things went wrong. 1  I have been anxious or worried for no good reason. 0  I have felt scared  or panicky for no good reason. 0  Things have been getting on top of me. 0  I have been so unhappy that I have had difficulty sleeping. 0  I have felt sad or miserable. 1  I have been so unhappy that I have been crying. 0  The thought of harming myself has occurred to me. 0  Edinburgh Postnatal Depression Scale Total 2      After visit meds:  Allergies as of 05/29/2022   No Known Allergies      Medication List     TAKE these medications    acetaminophen 500 MG tablet Commonly known as: TYLENOL Take 1 tablet (500 mg total) by mouth every 6 (six) hours as needed. For use AFTER surgery   calcium carbonate 500 MG chewable tablet Commonly known as: TUMS - dosed in mg elemental calcium Chew 1 tablet by mouth daily.   doxylamine (Sleep) 25 MG tablet Commonly known as: UNISOM Take 25 mg by mouth at bedtime as needed.   ibuprofen 600 MG tablet Commonly known as: ADVIL Take 1 tablet (600 mg total) by mouth every 6 (six) hours.   oxyCODONE 5 MG  immediate release tablet Commonly known as: Oxy IR/ROXICODONE Take 1 tablet (5 mg total) by mouth every 4 (four) hours as needed for severe pain.   prenatal multivitamin Tabs tablet Take 1 tablet by mouth daily at 12 noon.         Discharge home in stable condition Infant Feeding: Breast Infant Disposition:home with mother Discharge instruction: per After Visit Summary and Postpartum booklet. Activity: Advance as tolerated. Pelvic rest for 6 weeks.  Diet: routine diet Postpartum Appointment:2 weeks Follow up Visit:  Follow-up Information     Jamile Rekowski, MD. Schedule an appointment as soon as possible for a visit in 2 week(s).   Specialty: Obstetrics and Gynecology Contact information: [address] [place] [address] [phone]                     05/29/2022 Zenaida Niece, MD

## 2022-05-29 NOTE — Discharge Instructions (Signed)
As per discharge pamphlet °

## 2022-05-30 LAB — SURGICAL PATHOLOGY

## 2022-06-05 DIAGNOSIS — O900 Disruption of cesarean delivery wound: Secondary | ICD-10-CM | POA: Diagnosis not present

## 2022-06-07 DIAGNOSIS — O900 Disruption of cesarean delivery wound: Secondary | ICD-10-CM | POA: Diagnosis not present

## 2022-06-08 ENCOUNTER — Telehealth (HOSPITAL_COMMUNITY): Payer: Self-pay | Admitting: *Deleted

## 2022-06-08 NOTE — Telephone Encounter (Signed)
Left phone voicemail message.  Duffy Rhody, RN 06-08-2022 at 11:47am

## 2022-06-09 DIAGNOSIS — T8141XD Infection following a procedure, superficial incisional surgical site, subsequent encounter: Secondary | ICD-10-CM | POA: Diagnosis not present

## 2022-06-14 DIAGNOSIS — T8141XD Infection following a procedure, superficial incisional surgical site, subsequent encounter: Secondary | ICD-10-CM | POA: Diagnosis not present

## 2022-07-07 DIAGNOSIS — Z3009 Encounter for other general counseling and advice on contraception: Secondary | ICD-10-CM | POA: Diagnosis not present

## 2022-07-07 DIAGNOSIS — Z1389 Encounter for screening for other disorder: Secondary | ICD-10-CM | POA: Diagnosis not present

## 2022-08-28 DIAGNOSIS — F4320 Adjustment disorder, unspecified: Secondary | ICD-10-CM | POA: Diagnosis not present

## 2022-08-29 ENCOUNTER — Telehealth (HOSPITAL_COMMUNITY): Payer: Self-pay | Admitting: Lactation Services

## 2022-08-29 NOTE — Telephone Encounter (Signed)
Kelsey Barnett, mother of infant born 05/26/2021, who is exclusively pumping called with questions regarding "plugged ducts". [name] had a bilateral breast reduction 3 years ago. She is expressing 1-2.5 oz per pumping session. She reports she is pumping less due to infant sleeping longer periods of time. She pumps based on his feeding demands not by schedule. She reports having other plugged ducts since delivery and they have resolved within 12 hours with pumping and slight massage. She denies fever, increase pain, or red streaks in the breast. She reports 3 red spots under nipple for the past 3 days. Reviewed BAIT for management of mastitis symptoms, instructed to call the doctor if fever or condition worsen, increase pumping and breast milk removal to avoid long periods of milk statis.   Mother agreeable and verbalized understanding.

## 2022-09-13 DIAGNOSIS — F4320 Adjustment disorder, unspecified: Secondary | ICD-10-CM | POA: Diagnosis not present

## 2022-09-22 DIAGNOSIS — F4320 Adjustment disorder, unspecified: Secondary | ICD-10-CM | POA: Diagnosis not present

## 2022-09-29 DIAGNOSIS — F4320 Adjustment disorder, unspecified: Secondary | ICD-10-CM | POA: Diagnosis not present

## 2022-10-05 DIAGNOSIS — F4320 Adjustment disorder, unspecified: Secondary | ICD-10-CM | POA: Diagnosis not present

## 2022-10-06 ENCOUNTER — Other Ambulatory Visit: Payer: Self-pay | Admitting: Family Medicine

## 2022-10-06 DIAGNOSIS — R1011 Right upper quadrant pain: Secondary | ICD-10-CM | POA: Diagnosis not present

## 2022-10-13 ENCOUNTER — Ambulatory Visit
Admission: RE | Admit: 2022-10-13 | Discharge: 2022-10-13 | Disposition: A | Discharge status: Home / Self Care | Payer: BC Managed Care – PPO | Source: Ambulatory Visit | Attending: Family Medicine | Admitting: Family Medicine

## 2022-10-13 DIAGNOSIS — F4320 Adjustment disorder, unspecified: Secondary | ICD-10-CM | POA: Diagnosis not present

## 2022-10-13 DIAGNOSIS — K8020 Calculus of gallbladder without cholecystitis without obstruction: Secondary | ICD-10-CM | POA: Diagnosis not present

## 2022-10-13 DIAGNOSIS — R1011 Right upper quadrant pain: Secondary | ICD-10-CM | POA: Diagnosis not present

## 2022-10-27 DIAGNOSIS — F4320 Adjustment disorder, unspecified: Secondary | ICD-10-CM | POA: Diagnosis not present

## 2022-11-01 ENCOUNTER — Other Ambulatory Visit: Payer: Self-pay | Admitting: General Surgery

## 2022-11-01 DIAGNOSIS — K8020 Calculus of gallbladder without cholecystitis without obstruction: Secondary | ICD-10-CM | POA: Diagnosis not present

## 2022-11-03 DIAGNOSIS — F4320 Adjustment disorder, unspecified: Secondary | ICD-10-CM | POA: Diagnosis not present

## 2022-11-07 DIAGNOSIS — Z6824 Body mass index (BMI) 24.0-24.9, adult: Secondary | ICD-10-CM | POA: Diagnosis not present

## 2022-11-07 DIAGNOSIS — M545 Low back pain, unspecified: Secondary | ICD-10-CM | POA: Diagnosis not present

## 2022-11-07 DIAGNOSIS — M62830 Muscle spasm of back: Secondary | ICD-10-CM | POA: Diagnosis not present

## 2022-11-17 DIAGNOSIS — F4320 Adjustment disorder, unspecified: Secondary | ICD-10-CM | POA: Diagnosis not present

## 2022-11-20 ENCOUNTER — Encounter (HOSPITAL_BASED_OUTPATIENT_CLINIC_OR_DEPARTMENT_OTHER): Payer: Self-pay | Admitting: General Surgery

## 2022-11-24 DIAGNOSIS — F4320 Adjustment disorder, unspecified: Secondary | ICD-10-CM | POA: Diagnosis not present

## 2022-11-24 MED ORDER — CHLORHEXIDINE GLUCONATE CLOTH 2 % EX PADS: 6.0000 | MEDICATED_PAD | Freq: Once | CUTANEOUS | Status: DC

## 2022-11-24 MED ORDER — ENSURE PRE-SURGERY PO LIQD: 296.0000 mL | Freq: Once | ORAL | Status: DC

## 2022-11-24 NOTE — Progress Notes (Signed)

## 2022-11-27 NOTE — Anesthesia Preprocedure Evaluation (Signed)
Anesthesia Evaluation  Patient identified by MRN, date of birth, ID band Patient awake    Reviewed: Allergy & Precautions, NPO status , Patient's Chart, lab work & pertinent test results  Airway Mallampati: II  TM Distance: >3 FB Neck ROM: Full    Dental no notable dental hx. (+) Teeth Intact, Dental Advisory Given   Pulmonary neg pulmonary ROS   Pulmonary exam normal breath sounds clear to auscultation       Cardiovascular hypertension, Normal cardiovascular exam Rhythm:Regular Rate:Normal     Neuro/Psych negative neurological ROS  negative psych ROS   GI/Hepatic negative GI ROS, Neg liver ROS,,,  Endo/Other  negative endocrine ROS    Renal/GU negative Renal ROS  negative genitourinary   Musculoskeletal negative musculoskeletal ROS (+)    Abdominal   Peds  Hematology negative hematology ROS (+)   Anesthesia Other Findings   Reproductive/Obstetrics negative OB ROS                             Anesthesia Physical Anesthesia Plan  ASA: 1  Anesthesia Plan: General   Post-op Pain Management: Toradol IV (intra-op)*, Precedex and Tylenol PO (pre-op)*   Induction: Intravenous  PONV Risk Score and Plan: Treatment may vary due to age or medical condition, Midazolam, Dexamethasone and Aprepitant  Airway Management Planned: Oral ETT  Additional Equipment: None  Intra-op Plan:   Post-operative Plan: Extubation in OR  Informed Consent: I have reviewed the patients History and Physical, chart, labs and discussed the procedure including the risks, benefits and alternatives for the proposed anesthesia with the patient or authorized representative who has indicated his/her understanding and acceptance.     Dental advisory given  Plan Discussed with:   Anesthesia Plan Comments:         Anesthesia Quick Evaluation

## 2022-11-28 ENCOUNTER — Other Ambulatory Visit: Payer: Self-pay

## 2022-11-28 ENCOUNTER — Ambulatory Visit (HOSPITAL_BASED_OUTPATIENT_CLINIC_OR_DEPARTMENT_OTHER)
Admission: RE | Admit: 2022-11-28 | Discharge: 2022-11-28 | Disposition: A | Discharge status: Home / Self Care | Payer: BC Managed Care – PPO | Attending: General Surgery | Admitting: General Surgery

## 2022-11-28 ENCOUNTER — Encounter (HOSPITAL_BASED_OUTPATIENT_CLINIC_OR_DEPARTMENT_OTHER)
Admission: RE | Disposition: A | Discharge status: Home / Self Care | Payer: Self-pay | Source: Home / Self Care | Attending: General Surgery

## 2022-11-28 ENCOUNTER — Ambulatory Visit (HOSPITAL_BASED_OUTPATIENT_CLINIC_OR_DEPARTMENT_OTHER): Payer: Self-pay | Admitting: Anesthesiology

## 2022-11-28 ENCOUNTER — Encounter (HOSPITAL_BASED_OUTPATIENT_CLINIC_OR_DEPARTMENT_OTHER): Payer: Self-pay | Admitting: General Surgery

## 2022-11-28 DIAGNOSIS — Z419 Encounter for procedure for purposes other than remedying health state, unspecified: Secondary | ICD-10-CM

## 2022-11-28 DIAGNOSIS — I10 Essential (primary) hypertension: Secondary | ICD-10-CM | POA: Insufficient documentation

## 2022-11-28 DIAGNOSIS — K8020 Calculus of gallbladder without cholecystitis without obstruction: Secondary | ICD-10-CM

## 2022-11-28 DIAGNOSIS — K8010 Calculus of gallbladder with chronic cholecystitis without obstruction: Secondary | ICD-10-CM | POA: Insufficient documentation

## 2022-11-28 HISTORY — PX: CHOLECYSTECTOMY: SHX55

## 2022-11-28 LAB — POCT PREGNANCY, URINE: Preg Test, Ur: NEGATIVE

## 2022-11-28 SURGERY — LAPAROSCOPIC CHOLECYSTECTOMY
Anesthesia: General | Site: Abdomen

## 2022-11-28 MED ORDER — BUPIVACAINE HCL (PF) 0.5 % IJ SOLN
INTRAMUSCULAR | Status: DC | PRN
Start: 2022-11-28 — End: 2022-11-28
  Administered 2022-11-28: 20 mL

## 2022-11-28 MED ORDER — OXYCODONE HCL 5 MG/5ML PO SOLN: 5.0000 mg | Freq: Once | ORAL | Status: AC | PRN

## 2022-11-28 MED ORDER — ACETAMINOPHEN 500 MG PO TABS
1000.0000 mg | ORAL_TABLET | Freq: Once | ORAL | Status: AC
  Administered 2022-11-28: 1000 mg via ORAL

## 2022-11-28 MED ORDER — ONDANSETRON HCL 4 MG/2ML IJ SOLN
INTRAMUSCULAR | Status: AC
  Filled 2022-11-28: qty 2

## 2022-11-28 MED ORDER — KETOROLAC TROMETHAMINE 15 MG/ML IJ SOLN
INTRAMUSCULAR | Status: AC
  Filled 2022-11-28: qty 1

## 2022-11-28 MED ORDER — ROCURONIUM BROMIDE 100 MG/10ML IV SOLN
INTRAVENOUS | Status: DC | PRN
  Administered 2022-11-28: 50 mg via INTRAVENOUS

## 2022-11-28 MED ORDER — ONDANSETRON HCL 4 MG/2ML IJ SOLN: 4.0000 mg | Freq: Once | INTRAMUSCULAR | Status: DC | PRN

## 2022-11-28 MED ORDER — ACETAMINOPHEN 500 MG PO TABS
ORAL_TABLET | ORAL | Status: AC
  Filled 2022-11-28: qty 2

## 2022-11-28 MED ORDER — OXYCODONE HCL 5 MG PO TABS
ORAL_TABLET | ORAL | Status: AC
  Filled 2022-11-28: qty 1

## 2022-11-28 MED ORDER — KETOROLAC TROMETHAMINE 15 MG/ML IJ SOLN
15.0000 mg | Freq: Four times a day (QID) | INTRAMUSCULAR | Status: DC | PRN
  Administered 2022-11-28: 15 mg via INTRAVENOUS

## 2022-11-28 MED ORDER — DEXMEDETOMIDINE HCL IN NACL 200 MCG/50ML IV SOLN
INTRAVENOUS | Status: DC | PRN
Start: 2022-11-28 — End: 2022-11-28
  Administered 2022-11-28: 4 ug via INTRAVENOUS
  Administered 2022-11-28: 8 ug via INTRAVENOUS

## 2022-11-28 MED ORDER — ACETAMINOPHEN 500 MG PO TABS: 1000.0000 mg | ORAL_TABLET | ORAL | Status: DC

## 2022-11-28 MED ORDER — PROPOFOL 10 MG/ML IV BOLUS
INTRAVENOUS | Status: DC | PRN
  Administered 2022-11-28: 100 mg via INTRAVENOUS

## 2022-11-28 MED ORDER — FENTANYL CITRATE (PF) 100 MCG/2ML IJ SOLN
100.0000 ug | Freq: Once | INTRAMUSCULAR | Status: AC
  Administered 2022-11-28: 100 ug via INTRAVENOUS

## 2022-11-28 MED ORDER — HYDROMORPHONE HCL 1 MG/ML IJ SOLN: 0.2500 mg | INTRAMUSCULAR | Status: DC | PRN

## 2022-11-28 MED ORDER — MIDAZOLAM HCL 2 MG/2ML IJ SOLN
2.0000 mg | Freq: Once | INTRAMUSCULAR | Status: AC
  Administered 2022-11-28: 2 mg via INTRAVENOUS

## 2022-11-28 MED ORDER — LIDOCAINE HCL (CARDIAC) PF 100 MG/5ML IV SOSY
PREFILLED_SYRINGE | INTRAVENOUS | Status: DC | PRN
  Administered 2022-11-28: 60 mg via INTRAVENOUS

## 2022-11-28 MED ORDER — ONDANSETRON HCL 4 MG/2ML IJ SOLN
INTRAMUSCULAR | Status: DC | PRN
  Administered 2022-11-28: 4 mg via INTRAVENOUS

## 2022-11-28 MED ORDER — DROPERIDOL 2.5 MG/ML IJ SOLN
INTRAMUSCULAR | Status: AC
  Filled 2022-11-28: qty 2

## 2022-11-28 MED ORDER — DEXAMETHASONE SODIUM PHOSPHATE 4 MG/ML IJ SOLN
INTRAMUSCULAR | Status: DC | PRN
  Administered 2022-11-28: 5 mg via INTRAVENOUS

## 2022-11-28 MED ORDER — KETOROLAC TROMETHAMINE 30 MG/ML IJ SOLN
INTRAMUSCULAR | Status: AC
  Filled 2022-11-28: qty 1

## 2022-11-28 MED ORDER — DEXAMETHASONE SODIUM PHOSPHATE 10 MG/ML IJ SOLN
INTRAMUSCULAR | Status: AC
  Filled 2022-11-28: qty 1

## 2022-11-28 MED ORDER — BUPIVACAINE LIPOSOME 1.3 % IJ SUSP
INTRAMUSCULAR | Status: DC | PRN
Start: 2022-11-28 — End: 2022-11-28
  Administered 2022-11-28: 10 mL

## 2022-11-28 MED ORDER — LIDOCAINE 2% (20 MG/ML) 5 ML SYRINGE
INTRAMUSCULAR | Status: AC
  Filled 2022-11-28: qty 5

## 2022-11-28 MED ORDER — BUPIVACAINE HCL (PF) 0.25 % IJ SOLN
INTRAMUSCULAR | Status: DC | PRN
Start: 2022-11-28 — End: 2022-11-28
  Administered 2022-11-28: 4 mL

## 2022-11-28 MED ORDER — SODIUM CHLORIDE 0.9 % IR SOLN
Status: DC | PRN
  Administered 2022-11-28: 500 mL

## 2022-11-28 MED ORDER — KETOROLAC TROMETHAMINE 30 MG/ML IJ SOLN: 30.0000 mg | Freq: Once | INTRAMUSCULAR | Status: DC | PRN

## 2022-11-28 MED ORDER — CEFAZOLIN SODIUM-DEXTROSE 2-4 GM/100ML-% IV SOLN
INTRAVENOUS | Status: AC
  Filled 2022-11-28: qty 100

## 2022-11-28 MED ORDER — FENTANYL CITRATE (PF) 100 MCG/2ML IJ SOLN
INTRAMUSCULAR | Status: AC
  Filled 2022-11-28: qty 2

## 2022-11-28 MED ORDER — OXYCODONE HCL 5 MG PO TABS
5.0000 mg | ORAL_TABLET | Freq: Once | ORAL | Status: AC | PRN
  Administered 2022-11-28: 5 mg via ORAL

## 2022-11-28 MED ORDER — DROPERIDOL 2.5 MG/ML IJ SOLN
0.6250 mg | Freq: Once | INTRAMUSCULAR | Status: AC
  Administered 2022-11-28: 0.625 mg via INTRAVENOUS

## 2022-11-28 MED ORDER — MIDAZOLAM HCL 2 MG/2ML IJ SOLN
INTRAMUSCULAR | Status: AC
  Filled 2022-11-28: qty 2

## 2022-11-28 MED ORDER — SUGAMMADEX SODIUM 200 MG/2ML IV SOLN
INTRAVENOUS | Status: DC | PRN
Start: 2022-11-28 — End: 2022-11-28
  Administered 2022-11-28: 200 mg via INTRAVENOUS

## 2022-11-28 MED ORDER — CEFAZOLIN SODIUM-DEXTROSE 2-4 GM/100ML-% IV SOLN
2.0000 g | INTRAVENOUS | Status: AC
  Administered 2022-11-28: 2 g via INTRAVENOUS

## 2022-11-28 MED ORDER — LACTATED RINGERS IV SOLN: INTRAVENOUS | Status: DC

## 2022-11-28 MED ORDER — PROPOFOL 10 MG/ML IV BOLUS
INTRAVENOUS | Status: AC
  Filled 2022-11-28: qty 20

## 2022-11-28 MED ORDER — KETOROLAC TROMETHAMINE 15 MG/ML IJ SOLN
INTRAMUSCULAR | Status: DC | PRN
Start: 2022-11-28 — End: 2022-11-28
  Administered 2022-11-28: 15 mg via INTRAVENOUS

## 2022-11-28 MED ORDER — FENTANYL CITRATE (PF) 100 MCG/2ML IJ SOLN
INTRAMUSCULAR | Status: DC | PRN
  Administered 2022-11-28: 50 ug via INTRAVENOUS
  Administered 2022-11-28 (×2): 25 ug via INTRAVENOUS

## 2022-11-28 MED ORDER — TRAMADOL HCL 50 MG PO TABS
50.0000 mg | ORAL_TABLET | Freq: Four times a day (QID) | ORAL | 0 refills | Status: AC | PRN
Start: 2022-11-28 — End: ?

## 2022-11-28 SURGICAL SUPPLY — 47 items
ADH SKN CLS APL DERMABOND .7 (GAUZE/BANDAGES/DRESSINGS) ×1
APL PRP STRL LF DISP 70% ISPRP (MISCELLANEOUS) ×1
APPLIER CLIP 5 13 M/L LIGAMAX5 (MISCELLANEOUS) ×1
APR CLP MED LRG 5 ANG JAW (MISCELLANEOUS) ×1
BAG SPEC RTRVL 10 TROC 200 (ENDOMECHANICALS) ×1
BLADE CLIPPER SURG (BLADE) IMPLANT
CANISTER SUCT 1200ML W/VALVE (MISCELLANEOUS) ×1 IMPLANT
CHLORAPREP W/TINT 26 (MISCELLANEOUS) ×1 IMPLANT
CLIP APPLIE 5 13 M/L LIGAMAX5 (MISCELLANEOUS) ×1 IMPLANT
COVER MAYO STAND STRL (DRAPES) IMPLANT
DERMABOND ADVANCED .7 DNX12 (GAUZE/BANDAGES/DRESSINGS) ×1 IMPLANT
DEVICE TROCAR PUNCTURE CLOSURE (ENDOMECHANICALS) IMPLANT
DRAPE C-ARM 42X72 X-RAY (DRAPES) IMPLANT
DRAPE LAPAROSCOPIC ABDOMINAL (DRAPES) ×1 IMPLANT
ELECT REM PT RETURN 9FT ADLT (ELECTROSURGICAL) ×1
ELECTRODE REM PT RTRN 9FT ADLT (ELECTROSURGICAL) ×1 IMPLANT
GAUZE 4X4 16PLY ~~LOC~~+RFID DBL (SPONGE) ×1 IMPLANT
GLOVE BIO SURGEON STRL SZ7 (GLOVE) ×1 IMPLANT
GLOVE BIOGEL PI IND STRL 6.5 (GLOVE) IMPLANT
GLOVE BIOGEL PI IND STRL 7.0 (GLOVE) IMPLANT
GLOVE BIOGEL PI IND STRL 7.5 (GLOVE) ×1 IMPLANT
GLOVE ECLIPSE 6.5 STRL STRAW (GLOVE) IMPLANT
GLOVE SURG SS PI 6.5 STRL IVOR (GLOVE) IMPLANT
GOWN STRL REUS W/ TWL LRG LVL3 (GOWN DISPOSABLE) ×3 IMPLANT
GOWN STRL REUS W/TWL LRG LVL3 (GOWN DISPOSABLE) ×4
GRASPER SUT TROCAR 14GX15 (MISCELLANEOUS) IMPLANT
HEMOSTAT SNOW SURGICEL 2X4 (HEMOSTASIS) IMPLANT
IRRIG SUCT STRYKERFLOW 2 WTIP (MISCELLANEOUS) ×1
IRRIGATION SUCT STRKRFLW 2 WTP (MISCELLANEOUS) ×1 IMPLANT
KIT IMAGING PINPOINTPAQ (MISCELLANEOUS) ×1 IMPLANT
NS IRRIG 1000ML POUR BTL (IV SOLUTION) ×1 IMPLANT
PACK BASIN DAY SURGERY FS (CUSTOM PROCEDURE TRAY) ×1 IMPLANT
POUCH RETRIEVAL ECOSAC 10 (ENDOMECHANICALS) ×1 IMPLANT
SCISSORS LAP 5X35 DISP (ENDOMECHANICALS) ×1 IMPLANT
SET CHOLANGIOGRAPH 5 50 .035 (SET/KITS/TRAYS/PACK) IMPLANT
SET TUBE SMOKE EVAC HIGH FLOW (TUBING) ×1 IMPLANT
SLEEVE SCD COMPRESS KNEE MED (STOCKING) ×1 IMPLANT
SLEEVE Z-THREAD 5X100MM (TROCAR) ×2 IMPLANT
SPIKE FLUID TRANSFER (MISCELLANEOUS) IMPLANT
STRIP CLOSURE SKIN 1/2X4 (GAUZE/BANDAGES/DRESSINGS) ×1 IMPLANT
SUT MNCRL AB 4-0 PS2 18 (SUTURE) ×1 IMPLANT
SUT VICRYL 0 UR6 27IN ABS (SUTURE) IMPLANT
TOWEL GREEN STERILE FF (TOWEL DISPOSABLE) ×2 IMPLANT
TRAY LAPAROSCOPIC (CUSTOM PROCEDURE TRAY) ×1 IMPLANT
TROCAR BALLN 12MMX100 BLUNT (TROCAR) ×1 IMPLANT
TROCAR Z-THREAD OPTICAL 5X100M (TROCAR) ×1 IMPLANT
TUBE CONNECTING 20X1/4 (TUBING) ×1 IMPLANT

## 2022-11-28 NOTE — Addendum Note (Signed)
Addendum  created 11/28/22 1130 by Trevor Iha, MD   Child order released for a procedure order, Clinical Note Signed, Intraprocedure Blocks edited, Intraprocedure Meds edited, SmartForm saved

## 2022-11-28 NOTE — Interval H&P Note (Signed)
History and Physical Interval Note:  11/28/2022 8:30 AM  Kelsey Barnett  has presented today for surgery, with the diagnosis of GALLSTONES.  The various methods of treatment have been discussed with the patient and family. After consideration of risks, benefits and other options for treatment, the patient has consented to  Procedure(s): LAPAROSCOPIC CHOLECYSTECTOMY WITH ICG DYE (N/A) as a surgical intervention.  The patient's history has been reviewed, patient examined, no change in status, stable for surgery.  I have reviewed the patient's chart and labs.  Questions were answered to the patient's satisfaction.     Emelia Loron

## 2022-11-28 NOTE — H&P (Signed)
  32 yof who is otherwise healthy. She delivered a baby in April and is still breastfeeding. She has had several episodes of abdominal pain in ruq to back mostly after eating that have occurred since then. These are significant. Associated with nausea and emesis a couple times. No change in bms. US showed gallstones, there is no wall thickening, cbd normal. She is here to discuss options  Review of Systems: A complete review of systems was obtained from the patient. I have reviewed this information and discussed as appropriate with the patient. See HPI as well for other ROS.  Review of Systems  Gastrointestinal: Positive for abdominal pain and vomiting.  All other systems reviewed and are negative.   Medical History: History reviewed. No pertinent past medical history.  There is no problem list on file for this patient.  Past Surgical History:  Procedure Laterality Date  CESAREAN SECTION  COMBINED REDUCTION MAMMAPLASTY W/ ABDOMINOPLASTY    No Known Allergies  Current Outpatient Medications on File Prior to Visit  Medication Sig Dispense Refill  cephalexin (KEFLEX) 500 MG capsule TAKE 1 CAPSULE BY MOUTH EVERY 6 HOURS AS DIRECTED FOR 5 DAYS.  prenatal no115/iron/folic acid (PRENATAL 19 ORAL) Take 1 tablet by mouth daily with lunch   No current facility-administered medications on file prior to visit.   Family History  Problem Relation Age of Onset  Diabetes Father    Social History   Tobacco Use  Smoking Status Never  Smokeless Tobacco Never    Social History   Socioeconomic History  Marital status: Married  Tobacco Use  Smoking status: Never  Smokeless tobacco: Never  Substance and Sexual Activity  Alcohol use: Yes  Drug use: Never   Social Determinants of Health   Food Insecurity: No Food Insecurity (05/26/2022)  Received from [facility]  Hunger Vital Sign  Worried About Running Out of Food in the Last Year: Never true  Ran Out of Food in the Last Year:  Never true  Transportation Needs: No Transportation Needs (05/26/2022)  Received from [facility]  Lack of Transportation (Medical): No  Lack of Transportation (Non-Medical): No   Objective:   Vitals:  11/01/22 1544  BP: 116/75  Pulse: 89  Temp: 36.7 C (98 F)  SpO2: 98%  Weight: 63.5 kg (140 lb)  Height: 162.6 cm (5\' 4" )   Body mass index is 24.03 kg/m.  Physical Exam Vitals reviewed.  Constitutional:  Appearance: Normal appearance.  Eyes:  General: No scleral icterus. Abdominal:  General: There is no distension.  Palpations: Abdomen is soft.  Tenderness: There is no abdominal tenderness.  Neurological:  Mental Status: She is alert.     Assessment and Plan:   Diagnoses and all orders for this visit:  Gallstones  Lap chole with ICG dye  I do think symptoms are from her gallbladder.  I discussed the procedure in detail. We discussed the risks and benefits of a laparoscopic cholecystectomy and possible cholangiogram including, but not limited to bleeding, infection, injury to surrounding structures such as the intestine or liver, bile leak, retained gallstones, need to convert to an open procedure, prolonged diarrhea, blood clots such as DVT, common bile duct injury, anesthesia risks, and possible need for additional procedures. The likelihood of improvement in symptoms and return to the patient's normal status is good. We discussed the typical post-operative recovery course.

## 2022-11-28 NOTE — Transfer of Care (Signed)
Immediate Anesthesia Transfer of Care Note  Patient: Kelsey Barnett  Procedure(s) Performed: LAPAROSCOPIC CHOLECYSTECTOMY WITH ICG DYE (Abdomen)  Patient Location: PACU  Anesthesia Type:General and Regional  Level of Consciousness: awake, alert , and patient cooperative  Airway & Oxygen Therapy: Patient Spontanous Breathing and Patient connected to face mask oxygen  Post-op Assessment: Report given to RN and Post -op Vital signs reviewed and stable  Post vital signs: Reviewed and stable  Last Vitals:  Vitals Value Taken Time  BP 123/79 11/28/22 1011  Temp    Pulse 98 11/28/22 1012  Resp 24 11/28/22 1012  SpO2 100 % 11/28/22 1012  Vitals shown include unfiled device data.  Last Pain:  Vitals:   11/28/22 9562  TempSrc: Oral  PainSc: 0-No pain      Patients Stated Pain Goal: 3 (11/28/22 1308)  Complications: No notable events documented.

## 2022-11-28 NOTE — Anesthesia Postprocedure Evaluation (Signed)
Anesthesia Post Note  Patient: Kelsey Barnett  Procedure(s) Performed: LAPAROSCOPIC CHOLECYSTECTOMY WITH ICG DYE (Abdomen)     Patient location during evaluation: PACU Anesthesia Type: General Level of consciousness: awake and alert Pain management: pain level controlled Vital Signs Assessment: post-procedure vital signs reviewed and stable Respiratory status: spontaneous breathing, nonlabored ventilation, respiratory function stable and patient connected to nasal cannula oxygen Cardiovascular status: blood pressure returned to baseline and stable Postop Assessment: no apparent nausea or vomiting Anesthetic complications: no   No notable events documented.  Last Vitals:  Vitals:   11/28/22 1045 11/28/22 1100  BP: 121/79 111/71  Pulse: 77   Resp: 17   Temp:  36.4 C  SpO2: 97%     Last Pain:  Vitals:   11/28/22 1100  TempSrc:   PainSc: 4                  Trevor Iha

## 2022-11-28 NOTE — Progress Notes (Signed)
Assisted Dr. Richardson Landry with right, transabdominal plane, ultrasound guided block. Side rails up, monitors on throughout procedure. See vital signs in flow sheet. Tolerated Procedure well.

## 2022-11-28 NOTE — Op Note (Signed)
Preoperative diagnosis: Symptomatic cholelithiasis Postoperative diagnosis: Same as above Procedure: Laparoscopic cholecystectomy with ICG dye Surgeon: Dr. Harden Mo Assistant: Juanita Laster, PA student Anesthesia: General With bilateral tap block Complications: None Drains: None Specimens: Gallbladder and contents to pathology Sponge and needle count was correct at completion Disposition to recovery stable condition  Indications: This is a healthy 32 year old female who is postpartum since April.  She has had several episodes of right upper quadrant pain radiating to her back mostly after eating.  These have become significant.  They have been associated with nausea and emesis.  Ultrasound showed gallstones with a normal common bile duct and no wall thickening.  She appeared to have symptomatic cholelithiasis and we discussed proceeding with a laparoscopic cholecystectomy.  Procedure: After informed consent was obtained she first went to tap block with anesthesia.  She was given ICG dye.  She was given antibiotics.  SCDs were in place.  She was placed under general anesthesia without complication.  She was prepped and draped in the standard sterile surgical fashion.  A surgical timeout was then performed.  I infiltrated Marcaine below the umbilicus.  I made a vertical incision.  I carried this to the fascia.  I incised the fascia sharply and entered the peritoneum bluntly without injury.  I placed a 0 Vicryl pursestring suture through the fascia and inserted a Hassan trocar.  I then insufflated the abdomen to 15 mmHg pressure.  I then placed 3 additional 5 mm trocars under direct vision in the epigastrium and right upper quadrant.  I then retracted her gallbladder cephalad and lateral.  I then dissected the triangle and clearly obtained a critical view of safety.  This was confirmed with the ICG dye as well.  I then clipped the cystic duct 3 times.  I divided it leaving 2 clips in place.  The  clips completely traverse the duct and the duct was viable.  I then treated the anterior and posterior branches of the artery in a similar fashion.  The gallbladder was then removed from the liver bed.  This was placed in retrieval bag and removed from the abdomen.  Hemostasis was obtained.  I irrigated until this was clear as I did spill a small amount of bile.  I then removed the [facility] trocar.  I tied my pursestring down.  I did place an additional 0 Vicryl suture through the fascia to completely obliterate the defect.  I then remove the remaining trocars and desufflated the abdomen.  These were closed with 4-0 Monocryl and glue.  She tolerated this well was extubated and transferred to recovery stable.

## 2022-11-28 NOTE — Anesthesia Procedure Notes (Signed)
Procedure Name: Intubation Date/Time: 11/28/2022 9:26 AM  Performed by: Karen Kitchens, CRNAPre-anesthesia Checklist: Patient identified, Emergency Drugs available, Suction available and Patient being monitored Patient Re-evaluated:Patient Re-evaluated prior to induction Oxygen Delivery Method: Circle system utilized Preoxygenation: Pre-oxygenation with 100% oxygen Induction Type: IV induction Ventilation: Mask ventilation without difficulty Laryngoscope Size: Mac and 4 Grade View: Grade I Tube type: Oral Tube size: 7.0 mm Number of attempts: 1 Airway Equipment and Method: Stylet and Oral airway Placement Confirmation: ETT inserted through vocal cords under direct vision, positive ETCO2, breath sounds checked- equal and bilateral and CO2 detector Secured at: 23 cm Tube secured with: Tape Dental Injury: Teeth and Oropharynx as per pre-operative assessment

## 2022-11-28 NOTE — Anesthesia Procedure Notes (Signed)
Anesthesia Regional Block: TAP block   Pre-Anesthetic Checklist: , timeout performed,  Correct Patient, Correct Site, Correct Laterality,  Correct Procedure, Correct Position, site marked,  Risks and benefits discussed,  At surgeon's request and post-op pain management  Laterality: Right  Prep: chloraprep, alcohol swabs       Needles:   Needle Type: Echogenic Needle     Needle Length: 9cm  Needle Gauge: 20     Additional Needles:   Procedures:,,,, ultrasound used (permanent image in chart),,    Narrative:  Start time: 11/28/2022 8:46 AM End time: 11/28/2022 8:53 AM Injection made incrementally with aspirations every 5 mL.

## 2022-11-28 NOTE — Discharge Instructions (Addendum)
[facility] LAPAROSCOPIC SURGERY: POST OP INSTRUCTIONS  Always review your discharge instruction sheet given to you by the facility where your surgery was performed. IF YOU HAVE DISABILITY OR FAMILY LEAVE FORMS, YOU MUST BRING THEM TO THE OFFICE FOR PROCESSING.   DO NOT GIVE THEM TO YOUR DOCTOR.  A prescription for pain medication may be given to you upon discharge.  Take your pain medication as prescribed, if needed.  If narcotic pain medicine is not needed, then you may take acetaminophen (Tylenol), naprosyn (Alleve), or ibuprofen (Advil) as needed. Take your usually prescribed medications unless otherwise directed. If you need a refill on your pain medication, please contact your pharmacy.  They will contact our office to request authorization. Prescriptions will not be filled after 5pm or on week-ends. You should follow a light diet the first few days after arrival home, such as soup and crackers, etc.  Be sure to include lots of fluids daily. Most patients will experience some swelling and bruising in the area of the incisions.  Ice packs will help.  Swelling and bruising can take several days to resolve.  It is common to experience some constipation if taking pain medication after surgery.  Increasing fluid intake and taking a stool softener (such as Colace) will usually help or prevent this problem from occurring.  A mild laxative (Milk of Magnesia or Miralax) should be taken according to package instructions if there are no bowel movements after 48 hours. Unless discharge instructions indicate otherwise, you may remove your bandages 48 hours after surgery, and you may shower at that time.  You may have steri-strips (small skin tapes) in place directly over the incision.  These strips should be left on the skin for 7-10 days.  If your surgeon used skin glue on the incision, you may shower in 24 hours.  The glue will flake off over the next  2-3 weeks.  Any sutures or staples will be removed at the office during your follow-up visit. ACTIVITIES:  You may resume regular (light) daily activities beginning the next day--such as daily self-care, walking, climbing stairs--gradually increasing activities as tolerated.  You may have sexual intercourse when it is comfortable.  Refrain from any heavy lifting or straining until approved by your doctor. You may drive when you are no longer taking prescription pain medication, you can comfortably wear a seatbelt, and you can safely maneuver your car and apply brakes. RETURN TO WORK:  __________________________________________________________ Kelsey Barnett should see your doctor in the office for a follow-up appointment approximately 2-3 weeks after your surgery.  Make sure that you call for this appointment within a day or two after you arrive home to insure a convenient appointment time. OTHER INSTRUCTIONS: __________________________________________________________________________________________________________________________ __________________________________________________________________________________________________________________________ WHEN TO CALL YOUR DOCTOR: Fever over 101.0 Inability to urinate Continued bleeding from incision. Increased pain, redness, or drainage from the incision. Increasing abdominal pain  The clinic staff is available to answer your questions during regular business hours.  Please don't hesitate to call and ask to speak to one of the nurses for clinical concerns.  If you have a medical emergency, go to the nearest emergency room or call 911.  A surgeon from [facility] is always on call at the hospital. [address], [place], [place]  [address] ? [address] [address], [place], [place]   [address] [phone] ? [phone] ? FAX [phone] Web site: [email or web address]   Post Anesthesia Home Care Instructions  Activity: Get  plenty of  rest for the remainder of the day. A responsible individual must stay with you for 24 hours following the procedure.  For the next 24 hours, DO NOT: -Drive a car -Advertising copywriter -Drink alcoholic beverages -Take any medication unless instructed by your physician -Make any legal decisions or sign important papers.  Meals: Start with liquid foods such as gelatin or soup. Progress to regular foods as tolerated. Avoid greasy, spicy, heavy foods. If nausea and/or vomiting occur, drink only clear liquids until the nausea and/or vomiting subsides. Call your physician if vomiting continues.  Special Instructions/Symptoms: Your throat may feel dry or sore from the anesthesia or the breathing tube placed in your throat during surgery. If this causes discomfort, gargle with warm salt water. The discomfort should disappear within 24 hours.  If you had a scopolamine patch placed behind your ear for the management of post- operative nausea and/or vomiting:  1. The medication in the patch is effective for 72 hours, after which it should be removed.  Wrap patch in a tissue and discard in the trash. Wash hands thoroughly with soap and water. 2. You may remove the patch earlier than 72 hours if you experience unpleasant side effects which may include dry mouth, dizziness or visual disturbances. 3. Avoid touching the patch. Wash your hands with soap and water after contact with the patch.    Information for Discharge Teaching: EXPAREL (bupivacaine liposome injectable suspension)   Pain relief is important to your recovery. The goal is to control your pain so you can move easier and return to your normal activities as soon as possible after your procedure. Your physician may use several types of medicines to manage pain, swelling, and more.  Your surgeon or anesthesiologist gave you EXPAREL(bupivacaine) to help control your pain after surgery.  EXPAREL is a local anesthetic designed to release  slowly over an extended period of time to provide pain relief by numbing the tissue around the surgical site. EXPAREL is designed to release pain medication over time and can control pain for up to 72 hours. Depending on how you respond to EXPAREL, you may require less pain medication during your recovery. EXPAREL can help reduce or eliminate the need for opioids during the first few days after surgery when pain relief is needed the most. EXPAREL is not an opioid and is not addictive. It does not cause sleepiness or sedation.   Important! A teal colored band has been placed on your arm with the date, time and amount of EXPAREL you have received. Please leave this armband in place for the full 96 hours following administration, and then you may remove the band. If you return to the hospital for any reason within 96 hours following the administration of EXPAREL, the armband provides important information that your health care providers to know, and alerts them that you have received this anesthetic.    Possible side effects of EXPAREL: Temporary loss of sensation or ability to move in the area where medication was injected. Nausea, vomiting, constipation Rarely, numbness and tingling in your mouth or lips, lightheadedness, or anxiety may occur. Call your doctor right away if you think you may be experiencing any of these sensations, or if you have other questions regarding possible side effects.  Follow all other discharge instructions given to you by your surgeon or nurse. Eat a healthy diet and drink plenty of water or other fluids.  Regional Anesthesia Blocks  1. You may not be able to move or feel the "  blocked" extremity after a regional anesthetic block. This may last may last from 3-48 hours after placement, but it will go away. The length of time depends on the medication injected and your individual response to the medication. As the nerves start to wake up, you may experience tingling as the  movement and feeling returns to your extremity. If the numbness and inability to move your extremity has not gone away after 48 hours, please call your surgeon.   2. The extremity that is blocked will need to be protected until the numbness is gone and the strength has returned. Because you cannot feel it, you will need to take extra care to avoid injury. Because it may be weak, you may have difficulty moving it or using it. You may not know what position it is in without looking at it while the block is in effect.  3. For blocks in the legs and feet, returning to weight bearing and walking needs to be done carefully. You will need to wait until the numbness is entirely gone and the strength has returned. You should be able to move your leg and foot normally before you try and bear weight or walk. You will need someone to be with you when you first try to ensure you do not fall and possibly risk injury.  4. Bruising and tenderness at the needle site are common side effects and will resolve in a few days.  5. Persistent numbness or new problems with movement should be communicated to the surgeon or the [facility] [phone] [facility] [phone]).  Next dose of tylenol not available until 1:15pm Next dose of ibuprofen not available until 6:35pm

## 2022-11-29 LAB — SURGICAL PATHOLOGY

## 2022-11-30 ENCOUNTER — Encounter (HOSPITAL_BASED_OUTPATIENT_CLINIC_OR_DEPARTMENT_OTHER): Payer: Self-pay | Admitting: General Surgery

## 2022-12-06 DIAGNOSIS — F4320 Adjustment disorder, unspecified: Secondary | ICD-10-CM | POA: Diagnosis not present

## 2022-12-13 DIAGNOSIS — F4320 Adjustment disorder, unspecified: Secondary | ICD-10-CM | POA: Diagnosis not present

## 2022-12-20 DIAGNOSIS — F4320 Adjustment disorder, unspecified: Secondary | ICD-10-CM | POA: Diagnosis not present

## 2023-01-03 DIAGNOSIS — F4320 Adjustment disorder, unspecified: Secondary | ICD-10-CM | POA: Diagnosis not present

## 2023-01-10 DIAGNOSIS — F4320 Adjustment disorder, unspecified: Secondary | ICD-10-CM | POA: Diagnosis not present

## 2023-01-17 DIAGNOSIS — F4320 Adjustment disorder, unspecified: Secondary | ICD-10-CM | POA: Diagnosis not present

## 2023-01-22 DIAGNOSIS — F4320 Adjustment disorder, unspecified: Secondary | ICD-10-CM | POA: Diagnosis not present

## 2023-01-31 DIAGNOSIS — N390 Urinary tract infection, site not specified: Secondary | ICD-10-CM | POA: Diagnosis not present

## 2023-01-31 DIAGNOSIS — R300 Dysuria: Secondary | ICD-10-CM | POA: Diagnosis not present

## 2023-02-02 DIAGNOSIS — F4320 Adjustment disorder, unspecified: Secondary | ICD-10-CM | POA: Diagnosis not present

## 2023-02-20 DIAGNOSIS — F4320 Adjustment disorder, unspecified: Secondary | ICD-10-CM | POA: Diagnosis not present

## 2023-02-21 DIAGNOSIS — Z130 Encounter for screening for diseases of the blood and blood-forming organs and certain disorders involving the immune mechanism: Secondary | ICD-10-CM | POA: Diagnosis not present

## 2023-02-21 DIAGNOSIS — Z124 Encounter for screening for malignant neoplasm of cervix: Secondary | ICD-10-CM | POA: Diagnosis not present

## 2023-02-21 DIAGNOSIS — Z1151 Encounter for screening for human papillomavirus (HPV): Secondary | ICD-10-CM | POA: Diagnosis not present

## 2023-02-21 DIAGNOSIS — Z01419 Encounter for gynecological examination (general) (routine) without abnormal findings: Secondary | ICD-10-CM | POA: Diagnosis not present

## 2023-03-02 DIAGNOSIS — F4320 Adjustment disorder, unspecified: Secondary | ICD-10-CM | POA: Diagnosis not present

## 2023-03-09 DIAGNOSIS — F4320 Adjustment disorder, unspecified: Secondary | ICD-10-CM | POA: Diagnosis not present

## 2023-03-16 DIAGNOSIS — F4320 Adjustment disorder, unspecified: Secondary | ICD-10-CM | POA: Diagnosis not present

## 2023-03-23 DIAGNOSIS — Z0000 Encounter for general adult medical examination without abnormal findings: Secondary | ICD-10-CM | POA: Diagnosis not present

## 2023-03-23 DIAGNOSIS — Z23 Encounter for immunization: Secondary | ICD-10-CM | POA: Diagnosis not present

## 2023-03-23 DIAGNOSIS — F4320 Adjustment disorder, unspecified: Secondary | ICD-10-CM | POA: Diagnosis not present

## 2023-04-03 DIAGNOSIS — F4320 Adjustment disorder, unspecified: Secondary | ICD-10-CM | POA: Diagnosis not present

## 2023-04-18 DIAGNOSIS — F4320 Adjustment disorder, unspecified: Secondary | ICD-10-CM | POA: Diagnosis not present

## 2023-05-01 DIAGNOSIS — F4320 Adjustment disorder, unspecified: Secondary | ICD-10-CM | POA: Diagnosis not present

## 2023-05-17 DIAGNOSIS — F4320 Adjustment disorder, unspecified: Secondary | ICD-10-CM | POA: Diagnosis not present

## 2023-06-05 DIAGNOSIS — F4320 Adjustment disorder, unspecified: Secondary | ICD-10-CM | POA: Diagnosis not present

## 2023-06-14 DIAGNOSIS — F4320 Adjustment disorder, unspecified: Secondary | ICD-10-CM | POA: Diagnosis not present

## 2023-07-04 DIAGNOSIS — F4320 Adjustment disorder, unspecified: Secondary | ICD-10-CM | POA: Diagnosis not present

## 2023-07-16 DIAGNOSIS — F4320 Adjustment disorder, unspecified: Secondary | ICD-10-CM | POA: Diagnosis not present

## 2023-08-03 DIAGNOSIS — F4320 Adjustment disorder, unspecified: Secondary | ICD-10-CM | POA: Diagnosis not present

## 2023-08-13 DIAGNOSIS — F4320 Adjustment disorder, unspecified: Secondary | ICD-10-CM | POA: Diagnosis not present

## 2023-08-24 DIAGNOSIS — F4320 Adjustment disorder, unspecified: Secondary | ICD-10-CM | POA: Diagnosis not present

## 2023-09-14 DIAGNOSIS — F4320 Adjustment disorder, unspecified: Secondary | ICD-10-CM | POA: Diagnosis not present

## 2023-09-28 DIAGNOSIS — F4320 Adjustment disorder, unspecified: Secondary | ICD-10-CM | POA: Diagnosis not present

## 2023-10-18 DIAGNOSIS — F4320 Adjustment disorder, unspecified: Secondary | ICD-10-CM | POA: Diagnosis not present

## 2023-10-24 DIAGNOSIS — Z131 Encounter for screening for diabetes mellitus: Secondary | ICD-10-CM | POA: Diagnosis not present

## 2023-10-24 DIAGNOSIS — L659 Nonscarring hair loss, unspecified: Secondary | ICD-10-CM | POA: Diagnosis not present

## 2023-10-24 DIAGNOSIS — Z8742 Personal history of other diseases of the female genital tract: Secondary | ICD-10-CM | POA: Diagnosis not present

## 2023-10-24 DIAGNOSIS — Z13220 Encounter for screening for lipoid disorders: Secondary | ICD-10-CM | POA: Diagnosis not present

## 2023-10-24 DIAGNOSIS — Z20820 Contact with and (suspected) exposure to varicella: Secondary | ICD-10-CM | POA: Diagnosis not present

## 2023-10-24 DIAGNOSIS — Z23 Encounter for immunization: Secondary | ICD-10-CM | POA: Diagnosis not present

## 2023-11-01 DIAGNOSIS — Z23 Encounter for immunization: Secondary | ICD-10-CM | POA: Diagnosis not present

## 2023-11-14 DIAGNOSIS — F4320 Adjustment disorder, unspecified: Secondary | ICD-10-CM | POA: Diagnosis not present

## 2023-11-29 DIAGNOSIS — F4320 Adjustment disorder, unspecified: Secondary | ICD-10-CM | POA: Diagnosis not present

## 2023-12-11 DIAGNOSIS — F4320 Adjustment disorder, unspecified: Secondary | ICD-10-CM | POA: Diagnosis not present

## 2024-01-01 DIAGNOSIS — F4320 Adjustment disorder, unspecified: Secondary | ICD-10-CM | POA: Diagnosis not present

## 2024-01-02 DIAGNOSIS — Z23 Encounter for immunization: Secondary | ICD-10-CM | POA: Diagnosis not present

## 2024-01-22 DIAGNOSIS — F4320 Adjustment disorder, unspecified: Secondary | ICD-10-CM | POA: Diagnosis not present
